# Patient Record
Sex: Male | Born: 1950 | Race: White | Hispanic: No | Smoking: Current every day smoker
Health system: Southern US, Community
[De-identification: ages and names within clinical notes are randomized; demographics above are authoritative.]

## PROBLEM LIST (undated history)

## (undated) DIAGNOSIS — E785 Hyperlipidemia, unspecified: Secondary | ICD-10-CM

## (undated) DIAGNOSIS — N529 Male erectile dysfunction, unspecified: Secondary | ICD-10-CM

## (undated) DIAGNOSIS — M1990 Unspecified osteoarthritis, unspecified site: Secondary | ICD-10-CM

## (undated) DIAGNOSIS — F32A Depression, unspecified: Secondary | ICD-10-CM

## (undated) DIAGNOSIS — F329 Major depressive disorder, single episode, unspecified: Secondary | ICD-10-CM

## (undated) DIAGNOSIS — K635 Polyp of colon: Secondary | ICD-10-CM

## (undated) DIAGNOSIS — B019 Varicella without complication: Secondary | ICD-10-CM

## (undated) DIAGNOSIS — I10 Essential (primary) hypertension: Secondary | ICD-10-CM

## (undated) DIAGNOSIS — C4492 Squamous cell carcinoma of skin, unspecified: Secondary | ICD-10-CM

## (undated) HISTORY — DX: Squamous cell carcinoma of skin, unspecified: C44.92

## (undated) HISTORY — DX: Major depressive disorder, single episode, unspecified: F32.9

## (undated) HISTORY — DX: Polyp of colon: K63.5

## (undated) HISTORY — PX: SKIN BIOPSY: SHX1

## (undated) HISTORY — DX: Unspecified osteoarthritis, unspecified site: M19.90

## (undated) HISTORY — DX: Hyperlipidemia, unspecified: E78.5

## (undated) HISTORY — DX: Male erectile dysfunction, unspecified: N52.9

## (undated) HISTORY — DX: Varicella without complication: B01.9

## (undated) HISTORY — DX: Depression, unspecified: F32.A

---

## 2017-08-30 ENCOUNTER — Emergency Department: Payer: Medicare Other

## 2017-08-30 ENCOUNTER — Other Ambulatory Visit: Payer: Self-pay

## 2017-08-30 ENCOUNTER — Ambulatory Visit (HOSPITAL_COMMUNITY)
Admission: AD | Admit: 2017-08-30 | Discharge: 2017-08-30 | Disposition: A | Discharge status: Home / Self Care | Payer: Medicare Other | Source: Other Acute Inpatient Hospital | Attending: Emergency Medicine | Admitting: Emergency Medicine

## 2017-08-30 ENCOUNTER — Encounter: Payer: Self-pay | Admitting: Emergency Medicine

## 2017-08-30 ENCOUNTER — Emergency Department
Admission: EM | Admit: 2017-08-30 | Discharge: 2017-08-30 | Disposition: A | Discharge status: Other Acute Inpatient Hospital | Payer: Medicare Other | Attending: Emergency Medicine | Admitting: Emergency Medicine

## 2017-08-30 DIAGNOSIS — S2220XA Unspecified fracture of sternum, initial encounter for closed fracture: Secondary | ICD-10-CM | POA: Insufficient documentation

## 2017-08-30 DIAGNOSIS — I10 Essential (primary) hypertension: Secondary | ICD-10-CM | POA: Insufficient documentation

## 2017-08-30 DIAGNOSIS — Y929 Unspecified place or not applicable: Secondary | ICD-10-CM | POA: Diagnosis not present

## 2017-08-30 DIAGNOSIS — F17210 Nicotine dependence, cigarettes, uncomplicated: Secondary | ICD-10-CM | POA: Insufficient documentation

## 2017-08-30 DIAGNOSIS — Y999 Unspecified external cause status: Secondary | ICD-10-CM | POA: Insufficient documentation

## 2017-08-30 DIAGNOSIS — Y9389 Activity, other specified: Secondary | ICD-10-CM | POA: Diagnosis not present

## 2017-08-30 DIAGNOSIS — S01112A Laceration without foreign body of left eyelid and periocular area, initial encounter: Secondary | ICD-10-CM | POA: Insufficient documentation

## 2017-08-30 DIAGNOSIS — R55 Syncope and collapse: Secondary | ICD-10-CM | POA: Insufficient documentation

## 2017-08-30 DIAGNOSIS — R079 Chest pain, unspecified: Secondary | ICD-10-CM | POA: Insufficient documentation

## 2017-08-30 DIAGNOSIS — R0902 Hypoxemia: Secondary | ICD-10-CM | POA: Insufficient documentation

## 2017-08-30 DIAGNOSIS — R0989 Other specified symptoms and signs involving the circulatory and respiratory systems: Secondary | ICD-10-CM | POA: Diagnosis not present

## 2017-08-30 DIAGNOSIS — W2209XA Striking against other stationary object, initial encounter: Secondary | ICD-10-CM | POA: Insufficient documentation

## 2017-08-30 DIAGNOSIS — J690 Pneumonitis due to inhalation of food and vomit: Secondary | ICD-10-CM | POA: Diagnosis not present

## 2017-08-30 DIAGNOSIS — J189 Pneumonia, unspecified organism: Secondary | ICD-10-CM

## 2017-08-30 HISTORY — DX: Essential (primary) hypertension: I10

## 2017-08-30 LAB — CBC
HCT: 39.5 % — ABNORMAL LOW (ref 40.0–52.0)
Hemoglobin: 13.6 g/dL (ref 13.0–18.0)
MCH: 30.4 pg (ref 26.0–34.0)
MCHC: 34.4 g/dL (ref 32.0–36.0)
MCV: 88.3 fL (ref 80.0–100.0)
PLATELETS: 203 10*3/uL (ref 150–440)
RBC: 4.47 MIL/uL (ref 4.40–5.90)
RDW: 13.4 % (ref 11.5–14.5)
WBC: 8.8 10*3/uL (ref 3.8–10.6)

## 2017-08-30 LAB — BASIC METABOLIC PANEL
Anion gap: 12 (ref 5–15)
BUN: 18 mg/dL (ref 6–20)
CALCIUM: 9.4 mg/dL (ref 8.9–10.3)
CO2: 25 mmol/L (ref 22–32)
CREATININE: 1.11 mg/dL (ref 0.61–1.24)
Chloride: 97 mmol/L — ABNORMAL LOW (ref 101–111)
GFR calc non Af Amer: >60 mL/min (ref >60)
Glucose, Bld: 172 mg/dL — ABNORMAL HIGH (ref 65–99)
Potassium: 3.8 mmol/L (ref 3.5–5.1)
SODIUM: 134 mmol/L — AB (ref 135–145)

## 2017-08-30 LAB — TROPONIN I

## 2017-08-30 MED ORDER — HYDROMORPHONE HCL 1 MG/ML IJ SOLN
1.0000 mg | Freq: Once | INTRAMUSCULAR | Status: AC
  Administered 2017-08-30: 1 mg via INTRAVENOUS
  Filled 2017-08-30: qty 1

## 2017-08-30 MED ORDER — ONDANSETRON HCL 4 MG/2ML IJ SOLN
4.0000 mg | Freq: Once | INTRAMUSCULAR | Status: AC
  Administered 2017-08-30: 4 mg via INTRAVENOUS

## 2017-08-30 MED ORDER — ONDANSETRON HCL 4 MG/2ML IJ SOLN
4.0000 mg | Freq: Once | INTRAMUSCULAR | Status: AC
  Administered 2017-08-30: 4 mg via INTRAVENOUS
  Filled 2017-08-30: qty 2

## 2017-08-30 MED ORDER — IOPAMIDOL (ISOVUE-300) INJECTION 61%
125.0000 mL | Freq: Once | INTRAVENOUS | Status: AC | PRN
  Administered 2017-08-30: 125 mL via INTRAVENOUS

## 2017-08-30 MED ORDER — HYDROMORPHONE HCL 1 MG/ML IJ SOLN
1.0000 mg | Freq: Once | INTRAMUSCULAR | Status: AC
  Administered 2017-08-30: 1 mg via INTRAVENOUS

## 2017-08-30 MED ORDER — HYDROMORPHONE HCL 1 MG/ML IJ SOLN
INTRAMUSCULAR | Status: AC
  Administered 2017-08-30: 1 mg via INTRAVENOUS
  Filled 2017-08-30: qty 1

## 2017-08-30 MED ORDER — ONDANSETRON HCL 4 MG/2ML IJ SOLN
INTRAMUSCULAR | Status: AC
  Administered 2017-08-30: 4 mg via INTRAVENOUS
  Filled 2017-08-30: qty 2

## 2017-08-30 NOTE — ED Provider Notes (Signed)
-----------------------------------------   4:01 PM on 08/30/2017 -----------------------------------------  Pt brought to low acuity area. Found to be splinting and unable to take a deep breath or lie flat.  + lung sounds cxr  Reassuring. Concern on exam for sternal fx.  Pt requiring higher level of care and he has been immediately transferred to physician in acute care area, report given   Schuyler Amor, MD 08/30/17 [phone]

## 2017-08-30 NOTE — ED Provider Notes (Signed)
[facility] Emergency Department Provider Note ____________________________________________   First MD Initiated Contact with Patient 08/30/17 1601     (approximate)  I have reviewed the triage vital signs and the nursing notes.   HISTORY  Chief Complaint Chest Pain    HPI Samuel Mccormick is a 66 y.o. male past medical history of hypertension who presents with chest injury, acute onset proximal an hour ago when patient states he was eating something, started to feel he was choking and lost consciousness.  Patient states that when he awoke he was on the ground.  He believes that he passed out and struck his chest and his head on heater.  Patient then was able to drive himself to the emergency department, but reports persistent severe sternal area chest pain and associated shortness of breath.  He also reports mild pain where he has a laceration above his left eyebrow, but no generalized headache, or nausea or vomiting.  He denies feel lightheaded or dizzy prior to the choking episode, and has been in his usual state of health until this afternoon.  Past Medical History:  Diagnosis Date  . Hypertension     There are no active problems to display for this patient.   History reviewed. No pertinent surgical history.  Prior to Admission medications   Not on File    Allergies Patient has no known allergies.  History reviewed. No pertinent family history.  Social History Social History   Tobacco Use  . Smoking status: Current Every Day Smoker    Packs/day: 1.00    Types: Cigarettes  . Smokeless tobacco: Never Used  Substance Use Topics  . Alcohol use: Yes    Alcohol/week: 8.4 oz    Types: 14 Shots of liquor per week  . Drug use: No    Review of Systems  Constitutional: No fever. Eyes: No visual changes. ENT: No neck pain. Cardiovascular: Positive for chest pain. Respiratory: Positive for shortness of breath. Gastrointestinal: No nausea, no  vomiting.   Genitourinary: Negative for flank pain.  Musculoskeletal: Negative for back pain. Skin: Negative for rash. Neurological: Negative for headache.   ____________________________________________   PHYSICAL EXAM:  VITAL SIGNS: ED Triage Vitals  Enc Vitals Group     BP 08/30/17 1443 133/72     Pulse Rate 08/30/17 1443 97     Resp 08/30/17 1443 18     Temp 08/30/17 1443 98.1 F (36.7 C)     Temp Source 08/30/17 1443 Oral     SpO2 08/30/17 1443 97 %     Weight 08/30/17 1445 260 lb (117.9 kg)     Height 08/30/17 1445 5\' 9"  (1.753 m)     Head Circumference --      Peak Flow --      Pain Score 08/30/17 1442 10     Pain Loc --      Pain Edu? --      Excl. in [place]? --     Constitutional: Alert and oriented.  Uncomfortable appearing, but in no acute distress. Eyes: Conjunctivae are normal.  EOMI.  PERRLA. Head: 1 cm superficial laceration to lateral left eyebrow area. Nose: No congestion/rhinnorhea. Mouth/Throat: Mucous membranes are dry.    Neck: Normal range of motion.  Cervical spine nontender. Cardiovascular: Normal rate, regular rhythm. Grossly normal heart sounds.  Good peripheral circulation.  Moderate tenderness to sternal area. Respiratory: Normal respiratory effort.  No retractions.  Scattered rhonchi. Gastrointestinal: Soft and nontender.  Genitourinary: No flank tenderness. Musculoskeletal:  Extremities warm and well perfused.  Neurologic:  Normal speech and language. No gross focal neurologic deficits are appreciated.  Skin:  Skin is warm and dry. No rash noted. Psychiatric: Mood and affect are normal. Speech and behavior are normal.  ____________________________________________   LABS (all labs ordered are listed, but only abnormal results are displayed)  Labs Reviewed  BASIC METABOLIC PANEL - Abnormal; Notable for the following components:      Result Value   Sodium 134 (*)    Chloride 97 (*)    Glucose, Bld 172 (*)    All other components within  normal limits  CBC - Abnormal; Notable for the following components:   HCT 39.5 (*)    All other components within normal limits  TROPONIN I  BLOOD GAS, VENOUS   ____________________________________________  EKG  ED ECG REPORT I, Arta Silence, the attending physician, personally viewed and interpreted this ECG.  Date: 08/30/2017 EKG Time: 1440 Rate: 99 Rhythm: normal sinus rhythm QRS Axis: normal Intervals: normal ST/T Wave abnormalities: normal Narrative Interpretation: no evidence of acute ischemia  ____________________________________________  RADIOLOGY  CXR: Cortical irregularity of mid sternum, cannot r/o fracture CT chest: sternal fracture, pulmonary findings of possible early aspiration/pneumonitis CT abd: No traumatic injury  CT head: No ICH CT c-spine: No fracture   ____________________________________________   PROCEDURES  Procedure(s) performed: No    Critical Care performed: Yes  CRITICAL CARE Performed by: Arta Silence   Total critical care time: 40 minutes  Critical care time was exclusive of separately billable procedures and treating other patients.  Critical care was necessary to treat or prevent imminent or life-threatening deterioration.  Critical care was time spent personally by me on the following activities: development of treatment plan with patient and/or surrogate as well as nursing, discussions with consultants, evaluation of patient's response to treatment, examination of patient, obtaining history from patient or surrogate, ordering and performing treatments and interventions, ordering and review of laboratory studies, ordering and review of radiographic studies, pulse oximetry and re-evaluation of patient's condition. ____________________________________________   INITIAL IMPRESSION / ASSESSMENT AND PLAN / ED COURSE  Pertinent labs & imaging results that were available during my care of the patient were reviewed by  me and considered in my medical decision making (see chart for details).  66 year old male with past medical history as noted above presents with primary complaint of chest injury after syncopal rising during a brief choking episode, and falling from being seated onto heater.  Patient immediately regained consciousness and denies any prodrome or other recent symptoms leading into the choking or syncopal episode.  He also denies any current throat discomfort or persistent choking.  Past medical records reviewed in Epic and are noncontributory to this visit.  On exam, patient's vital signs are normal, but he is quite uncomfortable appearing and has significant pain to his sternum.  There are some rhonchi on lung exam, and moderate tenderness to the sternal area.  He has a small laceration to his left eyebrow.  The remainder of the exam is unremarkable.  Presentation is concerning for sternal fracture, and we also must consider pneumothorax, hemothorax, or pulmonary contusion contributed patient's symptoms.  I have low suspicion for ICH, however given patient's age we will obtain a CT head and C-spine.  Will also obtain CT abdomen pelvis to rule out any intra-abdominal injury.  In terms of the syncope, it appears to be related to an acute choking episode, and there is no evidence of cardiac or other  specific precipitating cause.  EKG is normal.  We will obtain basic labs.    ----------------------------------------- 6:05 PM on 08/30/2017 -----------------------------------------  After 2 more grams of Dilaudid patient was able to tolerate laying flat for CT.  CT reveals a sternal fracture, and there are pulmonary findings suggestive of possible early aspiration/pneumonitis.  Patient is hypoxic to the mid 80s on room air however on 3 L by nasal cannula he was in the low 90s.  He requires continuous pulse oximetry and frequent reassessment.  Given the traumatic injury with hypoxia and need for IV  analgesia for pain control, patient is not safe for discharge home at this time.  I discussed this with the patient and have arranged transfer to [facility].  I discussed the case and consulted with Dr. Pete Glatter of the [facility] emergency department and we will transferred the patient to the [facility] ED.  We discussed the CT findings of possible pneumonitis, and Dr. Pete Glatter recommended we hold off on antibiotics for now and he will reassess when the patient arrives.    ____________________________________________   FINAL CLINICAL IMPRESSION(S) / ED DIAGNOSES  Final diagnoses:  Closed fracture of sternum, unspecified portion of sternum, initial encounter  Hypoxia  Pneumonitis      NEW MEDICATIONS STARTED DURING THIS VISIT:  This SmartLink is deprecated. Use AVSMEDLIST instead to display the medication list for a patient.   Note:  This document was prepared using Dragon voice recognition software and may include unintentional dictation errors.    Arta Silence, MD 08/30/17 [phone]

## 2017-08-30 NOTE — ED Triage Notes (Addendum)
Pt states he was eating something and feels like he got choked. All pt remembers after that is coughing and then waking up on the floor. Pt thinks he passed out and struck his head and chest on heater. Bruising noted to chest, laceration to L eyebrow with dried blood running down pt's face.

## 2017-09-02 LAB — BLOOD GAS, VENOUS
Acid-base deficit: 0.6 mmol/L (ref 0.0–2.0)
Bicarbonate: 26.3 mmol/L (ref 20.0–28.0)
FIO2: 0.21
O2 Saturation: UNDETERMINED %
PH VEN: 7.32 (ref 7.250–7.430)
Patient temperature: 37
pCO2, Ven: 51 mmHg (ref 44.0–60.0)
pO2, Ven: UNDETERMINED mmHg (ref 32.0–45.0)

## 2017-10-06 ENCOUNTER — Ambulatory Visit (INDEPENDENT_AMBULATORY_CARE_PROVIDER_SITE_OTHER): Payer: Medicare Other | Admitting: Internal Medicine

## 2017-10-06 ENCOUNTER — Encounter: Payer: Self-pay | Admitting: Internal Medicine

## 2017-10-06 ENCOUNTER — Ambulatory Visit (INDEPENDENT_AMBULATORY_CARE_PROVIDER_SITE_OTHER): Payer: Medicare Other

## 2017-10-06 VITALS — BP 128/68 | HR 70 | Temp 97.9°F | Resp 16 | Ht 69.0 in | Wt 277.4 lb

## 2017-10-06 DIAGNOSIS — F17210 Nicotine dependence, cigarettes, uncomplicated: Secondary | ICD-10-CM

## 2017-10-06 DIAGNOSIS — R739 Hyperglycemia, unspecified: Secondary | ICD-10-CM

## 2017-10-06 DIAGNOSIS — Z720 Tobacco use: Secondary | ICD-10-CM

## 2017-10-06 DIAGNOSIS — Z23 Encounter for immunization: Secondary | ICD-10-CM

## 2017-10-06 DIAGNOSIS — G8929 Other chronic pain: Secondary | ICD-10-CM

## 2017-10-06 DIAGNOSIS — M25562 Pain in left knee: Secondary | ICD-10-CM

## 2017-10-06 DIAGNOSIS — Z13818 Encounter for screening for other digestive system disorders: Secondary | ICD-10-CM

## 2017-10-06 DIAGNOSIS — R7303 Prediabetes: Secondary | ICD-10-CM

## 2017-10-06 DIAGNOSIS — R490 Dysphonia: Secondary | ICD-10-CM

## 2017-10-06 DIAGNOSIS — I10 Essential (primary) hypertension: Secondary | ICD-10-CM

## 2017-10-06 DIAGNOSIS — K760 Fatty (change of) liver, not elsewhere classified: Secondary | ICD-10-CM

## 2017-10-06 DIAGNOSIS — Z1159 Encounter for screening for other viral diseases: Secondary | ICD-10-CM

## 2017-10-06 DIAGNOSIS — Z13220 Encounter for screening for lipoid disorders: Secondary | ICD-10-CM

## 2017-10-06 DIAGNOSIS — D649 Anemia, unspecified: Secondary | ICD-10-CM

## 2017-10-06 DIAGNOSIS — R1310 Dysphagia, unspecified: Secondary | ICD-10-CM

## 2017-10-06 DIAGNOSIS — N401 Enlarged prostate with lower urinary tract symptoms: Secondary | ICD-10-CM

## 2017-10-06 DIAGNOSIS — R351 Nocturia: Secondary | ICD-10-CM

## 2017-10-06 LAB — URINALYSIS, ROUTINE W REFLEX MICROSCOPIC
Bilirubin Urine: NEGATIVE
HGB URINE DIPSTICK: NEGATIVE
Ketones, ur: NEGATIVE
Leukocytes, UA: NEGATIVE
Nitrite: NEGATIVE
RBC / HPF: NONE SEEN (ref 0–?)
Specific Gravity, Urine: 1.02 (ref 1.000–1.030)
TOTAL PROTEIN, URINE-UPE24: NEGATIVE
URINE GLUCOSE: NEGATIVE
Urobilinogen, UA: 0.2 (ref 0.0–1.0)
WBC, UA: NONE SEEN (ref 0–?)
pH: 6 (ref 5.0–8.0)

## 2017-10-06 LAB — LIPID PANEL
CHOL/HDL RATIO: 3
Cholesterol: 177 mg/dL (ref 0–200)
HDL: 57.2 mg/dL (ref >39.00)
LDL Cholesterol: 81 mg/dL (ref 0–99)
NonHDL: 119.77
TRIGLYCERIDES: 194 mg/dL — AB (ref 0.0–149.0)
VLDL: 38.8 mg/dL (ref 0.0–40.0)

## 2017-10-06 LAB — HEPATIC FUNCTION PANEL
ALK PHOS: 62 U/L (ref 39–117)
ALT: 20 U/L (ref 0–53)
AST: 23 U/L (ref 0–37)
Albumin: 4.7 g/dL (ref 3.5–5.2)
Bilirubin, Direct: 0.1 mg/dL (ref 0.0–0.3)
Total Bilirubin: 0.5 mg/dL (ref 0.2–1.2)
Total Protein: 7.2 g/dL (ref 6.0–8.3)

## 2017-10-06 LAB — HEMOGLOBIN A1C: Hgb A1c MFr Bld: 6 % (ref 4.6–6.5)

## 2017-10-06 NOTE — Progress Notes (Addendum)
Chief Complaint  Patient presents with  . Establish Care   Establish care recently moved from [facility] though from [place] 1. HTN controlled on Lis-HCT 20-12.5 mg, Metoprolol 25 XL, norvasc 10  2. He c/o hoarseness x 1 year. He has never seen ENT  3. C/o worsening dysphagia he can even choke on his saliva and 08/2017 he has episode sitting on a stool choked on his saliva and had LOC per pt x 10-15 minutes falling off of stool and fracturing sternum. He was seen and tx'ed at [facility] trauma. As of 09/23/17 notes reviewed and fracture better. Pain is also better for pt he was on narcotics but now no longer taking oxycodone  4. He would like flu shot 5. Tobacco abuse since age 28s on and off mostly on > 1ppd or higher not FH lung cancer per pt  6. C/o left knee pain chronic and intermittent not as bothersome today had Oxycodone, tramadol 50 mg prn and prn NSAID to take. No longer needs narcotics    Review of Systems  Constitutional: Negative for weight loss.  HENT:       +hoarseness/changing voice never seen ENT ongoing x 1 year  +dysphagia choked 08/2017 causing him to pass out x 10-15 minutes per pt   Eyes:       No vision problems   Respiratory: Positive for cough. Negative for shortness of breath.   Cardiovascular: Positive for leg swelling. Negative for chest pain.  Gastrointestinal: Negative for abdominal pain.  Genitourinary:       +nocturia not bothersome +difficulty starting keeping stream +ED  Musculoskeletal: Positive for falls and joint pain.       Fall 08/2017 off stool and fractured Sternum seen at [facility] trauma clinic  +intermittent left knee pain  Skin: Negative for rash.  Neurological: Positive for loss of consciousness.       LOC 08/2017 with choking 08/2017   Psychiatric/Behavioral: Negative for depression.   Past Medical History:  Diagnosis Date  . Arthritis   . Chicken pox   . Colon polyps   . Depression   . Hyperlipidemia   . Hypertension   . Squamous cell skin  cancer    Past Surgical History:  Procedure Laterality Date  . SKIN BIOPSY     Family History  Problem Relation Age of Onset  . Cancer Mother        breast  . Other Mother        brain ? cancer   . Arthritis Sister   . COPD Sister   . Diabetes Sister   . Heart disease Sister   . Hyperlipidemia Sister   . Hypertension Sister   . Mental illness Sister   . Mental retardation Sister    Social History   Socioeconomic History  . Marital status: Widowed    Spouse name: Not on file  . Number of children: Not on file  . Years of education: Not on file  . Highest education level: Not on file  Social Needs  . Financial resource strain: Not on file  . Food insecurity - worry: Not on file  . Food insecurity - inability: Not on file  . Transportation needs - medical: Not on file  . Transportation needs - non-medical: Not on file  Occupational History  . Not on file  Tobacco Use  . Smoking status: Current Every Day Smoker    Packs/day: 1.00    Types: Cigarettes  . Smokeless tobacco: Never Used  Substance  and Sexual Activity  . Alcohol use: Yes    Alcohol/week: 8.4 oz    Types: 14 Shots of liquor per week  . Drug use: No  . Sexual activity: No  Other Topics Concern  . Not on file  Social History Narrative   From area lived in [place] recently moved back home    Retired from working in Management consultant medicine    1 son lives in [place]    Some college    Current Meds  Medication Sig  . acetaminophen (TYLENOL) 650 MG CR tablet Take 650 mg by mouth every 8 (eight) hours as needed for pain (alternating with Ibuprofen).  Marland Kitchen amLODipine (NORVASC) 10 MG tablet Take 10 mg by mouth.  . Cholecalciferol (VITAMIN D3) 2000 units TABS Take by mouth.  . fenofibrate 160 MG tablet Take 160 mg by mouth.  . gabapentin (NEURONTIN) 100 MG capsule take 2 capsules by mouth three times a day  . ibuprofen (ADVIL,MOTRIN) 800 MG tablet Take 800 mg by mouth as needed.   . [EXPIRED] lidocaine  (LIDODERM) 5 % Place onto the skin.  Marland Kitchen lisinopril-hydrochlorothiazide (PRINZIDE,ZESTORETIC) 20-12.5 MG tablet Take by mouth.  . metoprolol succinate (TOPROL-XL) 25 MG 24 hr tablet Take 25 mg by mouth.  . nicotine (NICODERM CQ - DOSED IN MG/24 HOURS) 21 mg/24hr patch place 1 patch ON THE SKIN daily  . omeprazole (PRILOSEC) 40 MG capsule Take 40 mg by mouth.  . RA COL-RITE 100 MG capsule Take 100 mg by mouth 2 (two) times daily.  . sildenafil (VIAGRA) 100 MG tablet Take 100 mg by mouth daily as needed for erectile dysfunction.  . traMADol (ULTRAM) 50 MG tablet Take by mouth daily as needed (left knee pain).  . [DISCONTINUED] oxyCODONE (OXY IR/ROXICODONE) 5 MG immediate release tablet MAY TAKE 1 to 2 tablets by mouth every 4 to 6 hours if needed for pain   No Known Allergies Recent Results (from the past 2160 hour(s))  Basic metabolic panel     Status: Abnormal   Collection Time: 08/30/17  2:46 PM  Result Value Ref Range   Sodium 134 (L) 135 - 145 mmol/L   Potassium 3.8 3.5 - 5.1 mmol/L   Chloride 97 (L) 101 - 111 mmol/L   CO2 25 22 - 32 mmol/L   Glucose, Bld 172 (H) 65 - 99 mg/dL   BUN 18 6 - 20 mg/dL   Creatinine, Ser 1.11 0.61 - 1.24 mg/dL   Calcium 9.4 8.9 - 10.3 mg/dL   GFR calc non Af Amer >60 >60 mL/min   GFR calc Af Amer >60 >60 mL/min    Comment: (NOTE) The eGFR has been calculated using the CKD EPI equation. This calculation has not been validated in all clinical situations. eGFR's persistently <60 mL/min signify possible Chronic Kidney Disease.    Anion gap 12 5 - 15  CBC     Status: Abnormal   Collection Time: 08/30/17  2:46 PM  Result Value Ref Range   WBC 8.8 3.8 - 10.6 K/uL   RBC 4.47 4.40 - 5.90 MIL/uL   Hemoglobin 13.6 13.0 - 18.0 g/dL   HCT 39.5 (L) 40.0 - 52.0 %   MCV 88.3 80.0 - 100.0 fL   MCH 30.4 26.0 - 34.0 pg   MCHC 34.4 32.0 - 36.0 g/dL   RDW 13.4 11.5 - 14.5 %   Platelets 203 150 - 440 K/uL  Troponin I     Status: None   Collection  Time: 08/30/17   2:46 PM  Result Value Ref Range   Troponin I <0.03 <0.03 ng/mL  Blood gas, venous     Status: None   Collection Time: 08/30/17  5:55 PM  Result Value Ref Range   FIO2 0.21    pH, Ven 7.32 7.250 - 7.430   pCO2, Ven 51 44.0 - 60.0 mmHg   pO2, Ven UNABLE TO CALCULATE. 32.0 - 45.0 mmHg   Bicarbonate 26.3 20.0 - 28.0 mmol/L   Acid-base deficit 0.6 0.0 - 2.0 mmol/L   O2 Saturation UNABLE TO CALCULATE. %   Patient temperature 37.0    Collection site VENOUS    Sample type VENOUS   Hepatic function panel     Status: None   Collection Time: 10/06/17  9:57 AM  Result Value Ref Range   Total Bilirubin 0.5 0.2 - 1.2 mg/dL   Bilirubin, Direct 0.1 0.0 - 0.3 mg/dL   Alkaline Phosphatase 62 39 - 117 U/L   AST 23 0 - 37 U/L   ALT 20 0 - 53 U/L   Total Protein 7.2 6.0 - 8.3 g/dL   Albumin 4.7 3.5 - 5.2 g/dL  Hemoglobin A1c     Status: None   Collection Time: 10/06/17  9:57 AM  Result Value Ref Range   Hgb A1c MFr Bld 6.0 4.6 - 6.5 %    Comment: Glycemic Control Guidelines for People with Diabetes:Non Diabetic:  <6%Goal of Therapy: <7%Additional Action Suggested:  >8%   Lipid panel     Status: Abnormal   Collection Time: 10/06/17  9:57 AM  Result Value Ref Range   Cholesterol 177 0 - 200 mg/dL    Comment: ATP III Classification       Desirable:  < 200 mg/dL               Borderline High:  200 - 239 mg/dL          High:  > = 240 mg/dL   Triglycerides 194.0 (H) 0.0 - 149.0 mg/dL    Comment: Normal:  <150 mg/dLBorderline High:  150 - 199 mg/dL   HDL 57.20 >39.00 mg/dL   VLDL 38.8 0.0 - 40.0 mg/dL   LDL Cholesterol 81 0 - 99 mg/dL   Total CHOL/HDL Ratio 3     Comment:                Men          Women1/2 Average Risk     3.4          3.3Average Risk          5.0          4.42X Average Risk          9.6          7.13X Average Risk          15.0          11.0                       NonHDL 119.77     Comment: NOTE:  Non-HDL goal should be 30 mg/dL higher than patient's LDL goal (i.e. LDL goal of < 70  mg/dL, would have non-HDL goal of < 100 mg/dL)  Urinalysis, Routine w reflex microscopic     Status: None   Collection Time: 10/06/17  9:57 AM  Result Value Ref Range   Color, Urine YELLOW Yellow;Lt. Yellow   APPearance CLEAR Clear  Specific Gravity, Urine 1.020 1.000 - 1.030   pH 6.0 5.0 - 8.0   Total Protein, Urine NEGATIVE Negative   Urine Glucose NEGATIVE Negative   Ketones, ur NEGATIVE Negative   Bilirubin Urine NEGATIVE Negative   Hgb urine dipstick NEGATIVE Negative   Urobilinogen, UA 0.2 0.0 - 1.0   Leukocytes, UA NEGATIVE Negative   Nitrite NEGATIVE Negative   WBC, UA none seen 0-2/hpf   RBC / HPF none seen 0-2/hpf   Objective  Body mass index is 40.96 kg/m. Wt Readings from Last 3 Encounters:  10/06/17 277 lb 6 oz (125.8 kg)  08/30/17 260 lb (117.9 kg)   Temp Readings from Last 3 Encounters:  10/06/17 97.9 F (36.6 C) (Oral)  08/30/17 97.6 F (36.4 C)   BP Readings from Last 3 Encounters:  10/06/17 128/68  08/30/17 (!) 148/79   Pulse Readings from Last 3 Encounters:  10/06/17 70  08/30/17 77   O2 sat room air 97%   Physical Exam  Constitutional: He is oriented to person, place, and time and well-developed, well-nourished, and in no distress. Vital signs are normal.  HENT:  Head: Normocephalic.  Mouth/Throat:    Right lower lip likely venous lake   Eyes: Conjunctivae are normal. Pupils are equal, round, and reactive to light.  Cardiovascular: Normal rate, regular rhythm and normal heart sounds.  +lymphedema noted b/l legs nonpitting.   Pulmonary/Chest: Effort normal and breath sounds normal.    +course breath sounds b/l   Abdominal: Soft. Bowel sounds are normal. There is no tenderness.  Neurological: He is alert and oriented to person, place, and time. Gait normal. Gait normal.  Skin: Skin is warm, dry and intact.  Psychiatric: Mood, memory, affect and judgment normal.  Nursing note and vitals reviewed.      Assessment   1. HTN controlled    2. Hyperglycemia w/o DM 2/obesity BMI 40.96  3. Dysphagia with even saliva  4. Hoarseness x 1 year never seen ENT  5. Tobacco abuse  6. Recent syncope s/p #3 with sternal fracture now healing  CXR 09/23/17 with remote sternal fracture deformity with ? Mild anterior displacement rec ed sternal radiographs for better assessment 7. HM 8. Mildly enlarged prostate with cental calcifications noted CT ab/pelvis 08/30/17  9. Normocytic Anemia noted 09/04/17 care everywhere H/H 10.5/31.1 10. Left knee pain chronic and intermittent Xray negative today  Plan   1. Cont lis-hct 20-12.5 need to confirm at f/u if taking 1 or 2 pills per day  Cont norvasc 10 mg qd  Cont toprol xl 25 mg qd  2. Check a1C today, check lipid  Congratulated started to be more physically active  3. Consider GI referral at f/u  4. Consider ENT referral at f/u  5 and 6. rec smoking cessation  Consider pfts to w/u COPD and albuterol inhaler prn in the future   Recent f/u [facility] trauma/general surgery 09/23/17 doing better f/u prn per note reviewed  Spent 3 minutes disc smoking and side effects. He is currently using nicotine patch trying to quit  Disc rec CT chest yearly with hx smoking  Reviewed CT chest 08/30/17 with ground glass opacities in right lower lobe   7.  Given flu shot today  Need to confirm other vaccines with prev PCP Dr. 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 Smoker on and off since age 21s has quit x 4 years previously smokes  >1ppd or more no FH lung cancer   8. Pt declines tx for now  Need to do DRE in future  9.  Consider check anemia panel in future  10 Xray left knee today  Prn Tylenol for now   Pharmacy prefers [facility]   Provider: Dr. Olivia Mackie McLean-Scocuzza-Internal Medicine

## 2017-10-06 NOTE — Patient Instructions (Signed)
Please follow up in 1 month  Take care  Consider shingrix vaccine  Please try to stop smoking   Hypertension Hypertension is another name for high blood pressure. High blood pressure forces your heart to work harder to pump blood. This can cause problems over time. There are two numbers in a blood pressure reading. There is a top number (systolic) over a bottom number (diastolic). It is best to have a blood pressure below 120/80. Healthy choices can help lower your blood pressure. You may need medicine to help lower your blood pressure if:  Your blood pressure cannot be lowered with healthy choices.  Your blood pressure is higher than 130/80.  Follow these instructions at home: Eating and drinking  If directed, follow the DASH eating plan. This diet includes: ? Filling half of your plate at each meal with fruits and vegetables. ? Filling one quarter of your plate at each meal with whole grains. Whole grains include whole wheat pasta, brown rice, and whole grain bread. ? Eating or drinking low-fat dairy products, such as skim milk or low-fat yogurt. ? Filling one quarter of your plate at each meal with low-fat (lean) proteins. Low-fat proteins include fish, skinless chicken, eggs, beans, and tofu. ? Avoiding fatty meat, cured and processed meat, or chicken with skin. ? Avoiding premade or processed food.  Eat less than 1,500 mg of salt (sodium) a day.  Limit alcohol use to no more than 1 drink a day for nonpregnant women and 2 drinks a day for men. One drink equals 12 oz of beer, 5 oz of wine, or 1 oz of hard liquor. Lifestyle  Work with your doctor to stay at a healthy weight or to lose weight. Ask your doctor what the best weight is for you.  Get at least 30 minutes of exercise that causes your heart to beat faster (aerobic exercise) most days of the week. This may include walking, swimming, or biking.  Get at least 30 minutes of exercise that strengthens your muscles (resistance  exercise) at least 3 days a week. This may include lifting weights or pilates.  Do not use any products that contain nicotine or tobacco. This includes cigarettes and e-cigarettes. If you need help quitting, ask your doctor.  Check your blood pressure at home as told by your doctor.  Keep all follow-up visits as told by your doctor. This is important. Medicines  Take over-the-counter and prescription medicines only as told by your doctor. Follow directions carefully.  Do not skip doses of blood pressure medicine. The medicine does not work as well if you skip doses. Skipping doses also puts you at risk for problems.  Ask your doctor about side effects or reactions to medicines that you should watch for. Contact a doctor if:  You think you are having a reaction to the medicine you are taking.  You have headaches that keep coming back (recurring).  You feel dizzy.  You have swelling in your ankles.  You have trouble with your vision. Get help right away if:  You get a very bad headache.  You start to feel confused.  You feel weak or numb.  You feel faint.  You get very bad pain in your: ? Chest. ? Belly (abdomen).  You throw up (vomit) more than once.  You have trouble breathing. Summary  Hypertension is another name for high blood pressure.  Making healthy choices can help lower blood pressure. If your blood pressure cannot be controlled with healthy choices,  you may need to take medicine. This information is not intended to replace advice given to you by your health care provider. Make sure you discuss any questions you have with your health care provider. Document Released: 02/18/2008 Document Revised: 07/30/2016 Document Reviewed: 07/30/2016 Elsevier Interactive Patient Education  [employer].

## 2017-10-07 DIAGNOSIS — Z720 Tobacco use: Secondary | ICD-10-CM | POA: Insufficient documentation

## 2017-10-07 DIAGNOSIS — D649 Anemia, unspecified: Secondary | ICD-10-CM | POA: Insufficient documentation

## 2017-10-07 DIAGNOSIS — R7303 Prediabetes: Secondary | ICD-10-CM | POA: Insufficient documentation

## 2017-10-07 DIAGNOSIS — N400 Enlarged prostate without lower urinary tract symptoms: Secondary | ICD-10-CM | POA: Insufficient documentation

## 2017-10-07 DIAGNOSIS — R1310 Dysphagia, unspecified: Secondary | ICD-10-CM | POA: Insufficient documentation

## 2017-10-07 DIAGNOSIS — R490 Dysphonia: Secondary | ICD-10-CM | POA: Insufficient documentation

## 2017-10-07 DIAGNOSIS — M25562 Pain in left knee: Secondary | ICD-10-CM | POA: Insufficient documentation

## 2017-10-07 DIAGNOSIS — K760 Fatty (change of) liver, not elsewhere classified: Secondary | ICD-10-CM | POA: Insufficient documentation

## 2017-10-07 DIAGNOSIS — I10 Essential (primary) hypertension: Secondary | ICD-10-CM | POA: Insufficient documentation

## 2017-10-07 LAB — HEPATITIS C ANTIBODY
Hepatitis C Ab: NONREACTIVE
SIGNAL TO CUT-OFF: 0.01 (ref <1.00)

## 2017-10-07 LAB — HEPATITIS B SURFACE ANTIBODY, QUANTITATIVE: Hepatitis B-Post: 521 m[IU]/mL (ref >=10)

## 2017-10-07 LAB — HEPATITIS B SURFACE ANTIGEN: Hepatitis B Surface Ag: NONREACTIVE

## 2017-11-06 ENCOUNTER — Encounter: Payer: Self-pay | Admitting: Internal Medicine

## 2017-11-06 ENCOUNTER — Ambulatory Visit (INDEPENDENT_AMBULATORY_CARE_PROVIDER_SITE_OTHER): Payer: Medicare HMO | Admitting: Internal Medicine

## 2017-11-06 VITALS — BP 140/70 | HR 78 | Temp 98.1°F | Ht 69.0 in | Wt 277.6 lb

## 2017-11-06 DIAGNOSIS — I10 Essential (primary) hypertension: Secondary | ICD-10-CM | POA: Diagnosis not present

## 2017-11-06 DIAGNOSIS — F17219 Nicotine dependence, cigarettes, with unspecified nicotine-induced disorders: Secondary | ICD-10-CM

## 2017-11-06 DIAGNOSIS — R490 Dysphonia: Secondary | ICD-10-CM | POA: Diagnosis not present

## 2017-11-06 DIAGNOSIS — J449 Chronic obstructive pulmonary disease, unspecified: Secondary | ICD-10-CM | POA: Diagnosis not present

## 2017-11-06 DIAGNOSIS — R1310 Dysphagia, unspecified: Secondary | ICD-10-CM | POA: Diagnosis not present

## 2017-11-06 DIAGNOSIS — Z720 Tobacco use: Secondary | ICD-10-CM

## 2017-11-06 DIAGNOSIS — K219 Gastro-esophageal reflux disease without esophagitis: Secondary | ICD-10-CM

## 2017-11-06 DIAGNOSIS — E781 Pure hyperglyceridemia: Secondary | ICD-10-CM

## 2017-11-06 DIAGNOSIS — G4733 Obstructive sleep apnea (adult) (pediatric): Secondary | ICD-10-CM | POA: Diagnosis not present

## 2017-11-06 DIAGNOSIS — E785 Hyperlipidemia, unspecified: Secondary | ICD-10-CM | POA: Diagnosis not present

## 2017-11-06 MED ORDER — LISINOPRIL-HYDROCHLOROTHIAZIDE 20-12.5 MG PO TABS: 1.0000 | ORAL_TABLET | Freq: Every day | ORAL | 1 refills | Status: DC

## 2017-11-06 MED ORDER — METOPROLOL SUCCINATE ER 25 MG PO TB24: 25.0000 mg | ORAL_TABLET | Freq: Every day | ORAL | 1 refills | Status: DC

## 2017-11-06 MED ORDER — OMEPRAZOLE 40 MG PO CPDR: 40.0000 mg | DELAYED_RELEASE_CAPSULE | Freq: Every day | ORAL | 1 refills | Status: DC

## 2017-11-06 MED ORDER — FENOFIBRATE 160 MG PO TABS: 160.0000 mg | ORAL_TABLET | Freq: Every day | ORAL | 1 refills | Status: DC

## 2017-11-06 MED ORDER — NICOTINE 14 MG/24HR TD PT24: 14.0000 mg | MEDICATED_PATCH | Freq: Every day | TRANSDERMAL | 0 refills | Status: DC

## 2017-11-06 MED ORDER — AMLODIPINE BESYLATE 10 MG PO TABS: 10.0000 mg | ORAL_TABLET | Freq: Every day | ORAL | 1 refills | Status: DC

## 2017-11-06 MED ORDER — ALBUTEROL SULFATE HFA 108 (90 BASE) MCG/ACT IN AERS: 1.0000 | INHALATION_SPRAY | Freq: Four times a day (QID) | RESPIRATORY_TRACT | 1 refills | Status: DC | PRN

## 2017-11-06 MED ORDER — NICOTINE 21 MG/24HR TD PT24: 21.0000 mg | MEDICATED_PATCH | Freq: Every day | TRANSDERMAL | 0 refills | Status: DC

## 2017-11-06 MED ORDER — NICOTINE 7 MG/24HR TD PT24: 7.0000 mg | MEDICATED_PATCH | Freq: Every day | TRANSDERMAL | 0 refills | Status: DC

## 2017-11-06 NOTE — Progress Notes (Signed)
Pre visit review using our clinic review tool, if applicable. No additional management support is needed unless otherwise documented below in the visit note. 

## 2017-11-06 NOTE — Patient Instructions (Signed)
Please f/u in 3 months  Will refer to [facility]NT Dr. McQueen/Vaught  Please try healthy diet and exercise  Take care   Chronic Obstructive Pulmonary Disease Chronic obstructive pulmonary disease (COPD) is a long-term (chronic) lung problem. When you have COPD, it is hard for air to get in and out of your lungs. The way your lungs work will never return to normal. Usually the condition gets worse over time. There are things you can do to keep yourself as healthy as possible. Your doctor may treat your condition with:  Medicines.  Quitting smoking, if you smoke.  Rehabilitation. This may involve a team of specialists.  Oxygen.  Exercise and changes to your diet.  Lung surgery.  Comfort measures (palliative care).  Follow these instructions at home: Medicines  Take over-the-counter and prescription medicines only as told by your doctor.  Talk to your doctor before taking any cough or allergy medicines. You may need to avoid medicines that cause your lungs to be dry. Lifestyle  If you smoke, stop. Smoking makes the problem worse. If you need help quitting, ask your doctor.  Avoid being around things that make your breathing worse. This may include smoke, chemicals, and fumes.  Stay active, but remember to also rest.  Learn and use tips on how to relax.  Make sure you get enough sleep. Most adults need at least 7 hours a night.  Eat healthy foods. Eat smaller meals more often. Rest before meals. Controlled breathing  Learn and use tips on how to control your breathing as told by your doctor. Try: ? Breathing in (inhaling) through your nose for 1 second. Then, pucker your lips and breath out (exhale) through your lips for 2 seconds. ? Putting one hand on your belly (abdomen). Breathe in slowly through your nose for 1 second. Your hand on your belly should move out. Pucker your lips and breathe out slowly through your lips. Your hand on your belly should move in as you breathe  out. Controlled coughing  Learn and use controlled coughing to clear mucus from your lungs. The steps are: 1. Lean your head a little forward. 2. Breathe in deeply. 3. Try to hold your breath for 3 seconds. 4. Keep your mouth slightly open while coughing 2 times. 5. Spit any mucus out into a tissue. 6. Rest and do the steps again 1 or 2 times as needed. General instructions  Make sure you get all the shots (vaccines) that your doctor recommends. Ask your doctor about a flu shot and a pneumonia shot.  Use oxygen therapy and therapy to help improve your lungs (pulmonary rehabilitation) if told by your doctor. If you need home oxygen therapy, ask your doctor if you should buy a tool to measure your oxygen level (oximeter).  Make a COPD action plan with your doctor. This helps you know what to do if you feel worse than usual.  Manage any other conditions you have as told by your doctor.  Avoid going outside when it is very hot, cold, or humid.  Avoid people who have a sickness you can catch (contagious).  Keep all follow-up visits as told by your doctor. This is important. Contact a doctor if:  You cough up more mucus than usual.  There is a change in the color or thickness of the mucus.  It is harder to breathe than usual.  Your breathing is faster than usual.  You have trouble sleeping.  You need to use your medicines more  often than usual.  You have trouble doing your normal activities such as getting dressed or walking around the house. Get help right away if:  You have shortness of breath while resting.  You have shortness of breath that stops you from: ? Being able to talk. ? Doing normal activities.  Your chest hurts for longer than 5 minutes.  Your skin color is more blue than usual.  Your pulse oximeter shows that you have low oxygen for longer than 5 minutes.  You have a fever.  You feel too tired to breathe normally. Summary  Chronic obstructive  pulmonary disease (COPD) is a long-term lung problem.  The way your lungs work will never return to normal. Usually the condition gets worse over time. There are things you can do to keep yourself as healthy as possible.  Take over-the-counter and prescription medicines only as told by your doctor.  If you smoke, stop. Smoking makes the problem worse. This information is not intended to replace advice given to you by your health care provider. Make sure you discuss any questions you have with your health care provider. Document Released: 02/18/2008 Document Revised: 02/07/2016 Document Reviewed: 04/28/2013 Elsevier Interactive Patient Education  2017 [employer].

## 2017-11-09 ENCOUNTER — Encounter: Payer: Self-pay | Admitting: Internal Medicine

## 2017-11-09 DIAGNOSIS — E785 Hyperlipidemia, unspecified: Secondary | ICD-10-CM | POA: Insufficient documentation

## 2017-11-09 DIAGNOSIS — G4733 Obstructive sleep apnea (adult) (pediatric): Secondary | ICD-10-CM | POA: Insufficient documentation

## 2017-11-09 NOTE — Progress Notes (Addendum)
Chief Complaint  Patient presents with  . Follow-up   F/u  1. HTN controlled on norvasc 10, lis-hct 20-12.5  2. H/o dysphagia solids and liquids and voice hoarseness in long term smoker will refer to ENT. He has been a smoker on and off since age 67 y.o  3. H/o OSA not on Cpap could not tolerate x 2-3 years. Ago when had sleep study in [place] 4. Tobacco abuse wants to do nicoderm patches    Review of Systems  Constitutional: Negative for weight loss.  HENT:       +hoarseness, dysphagia  Eyes: Negative for blurred vision.  Respiratory: Positive for cough.   Cardiovascular: Negative for chest pain.  Gastrointestinal: Negative for abdominal pain.  Musculoskeletal:       +sternum pain improved   Skin: Negative for rash.  Neurological: Negative for headaches.  Psychiatric/Behavioral: Negative for memory loss.   Past Medical History:  Diagnosis Date  . Arthritis   . Chicken pox   . Colon polyps   . Depression   . Hyperlipidemia   . Hypertension   . Squamous cell skin cancer    Past Surgical History:  Procedure Laterality Date  . SKIN BIOPSY     Family History  Problem Relation Age of Onset  . Cancer Mother        breast  . Other Mother        brain ? cancer   . Arthritis Sister   . COPD Sister   . Diabetes Sister   . Heart disease Sister   . Hyperlipidemia Sister   . Hypertension Sister   . Mental illness Sister   . Mental retardation Sister    Social History   Socioeconomic History  . Marital status: Widowed    Spouse name: Not on file  . Number of children: Not on file  . Years of education: Not on file  . Highest education level: Not on file  Social Needs  . Financial resource strain: Not on file  . Food insecurity - worry: Not on file  . Food insecurity - inability: Not on file  . Transportation needs - medical: Not on file  . Transportation needs - non-medical: Not on file  Occupational History  . Not on file  Tobacco Use  . Smoking status:  Current Every Day Smoker    Packs/day: 1.00    Types: Cigarettes  . Smokeless tobacco: Never Used  Substance and Sexual Activity  . Alcohol use: Yes    Alcohol/week: 8.4 oz    Types: 14 Shots of liquor per week  . Drug use: No  . Sexual activity: No  Other Topics Concern  . Not on file  Social History Narrative   From area lived in [place] recently moved back home    Retired from working in Management consultant medicine    1 son lives in [place]    Some college    Current Meds  Medication Sig  . acetaminophen (TYLENOL) 650 MG CR tablet Take 650 mg by mouth every 8 (eight) hours as needed for pain (alternating with Ibuprofen).  Marland Kitchen amLODipine (NORVASC) 10 MG tablet Take 1 tablet (10 mg total) by mouth daily.  . Cholecalciferol (VITAMIN D3) 2000 units TABS Take by mouth.  . fenofibrate 160 MG tablet Take 1 tablet (160 mg total) by mouth daily.  Marland Kitchen ibuprofen (ADVIL,MOTRIN) 800 MG tablet Take 800 mg by mouth as needed.   Marland Kitchen lisinopril-hydrochlorothiazide (PRINZIDE,ZESTORETIC) 20-12.5 MG tablet  Take 1 tablet by mouth daily.  . metoprolol succinate (TOPROL-XL) 25 MG 24 hr tablet Take 1 tablet (25 mg total) by mouth daily.  . nicotine (NICODERM CQ - DOSED IN MG/24 HOURS) 21 mg/24hr patch Place 1 patch (21 mg total) onto the skin daily. X 6 weeks  . omeprazole (PRILOSEC) 40 MG capsule Take 1 capsule (40 mg total) by mouth daily. On empty stomach  . traMADol (ULTRAM) 50 MG tablet Take by mouth daily as needed (left knee pain).  . [DISCONTINUED] amLODipine (NORVASC) 10 MG tablet Take 10 mg by mouth.  . [DISCONTINUED] fenofibrate 160 MG tablet Take 160 mg by mouth.  . [DISCONTINUED] gabapentin (NEURONTIN) 100 MG capsule take 2 capsules by mouth three times a day  . [DISCONTINUED] lisinopril-hydrochlorothiazide (PRINZIDE,ZESTORETIC) 20-12.5 MG tablet Take by mouth.  . [DISCONTINUED] metoprolol succinate (TOPROL-XL) 25 MG 24 hr tablet Take 25 mg by mouth.  . [DISCONTINUED] nicotine (NICODERM CQ - DOSED IN  MG/24 HOURS) 21 mg/24hr patch place 1 patch ON THE SKIN daily  . [DISCONTINUED] omeprazole (PRILOSEC) 40 MG capsule Take 40 mg by mouth.  . [DISCONTINUED] RA COL-RITE 100 MG capsule Take 100 mg by mouth 2 (two) times daily.  . [DISCONTINUED] sildenafil (VIAGRA) 100 MG tablet Take 100 mg by mouth daily as needed for erectile dysfunction.   No Known Allergies Recent Results (from the past 2160 hour(s))  Basic metabolic panel     Status: Abnormal   Collection Time: 08/30/17  2:46 PM  Result Value Ref Range   Sodium 134 (L) 135 - 145 mmol/L   Potassium 3.8 3.5 - 5.1 mmol/L   Chloride 97 (L) 101 - 111 mmol/L   CO2 25 22 - 32 mmol/L   Glucose, Bld 172 (H) 65 - 99 mg/dL   BUN 18 6 - 20 mg/dL   Creatinine, Ser 1.11 0.61 - 1.24 mg/dL   Calcium 9.4 8.9 - 10.3 mg/dL   GFR calc non Af Amer >60 >60 mL/min   GFR calc Af Amer >60 >60 mL/min    Comment: (NOTE) The eGFR has been calculated using the CKD EPI equation. This calculation has not been validated in all clinical situations. eGFR's persistently <60 mL/min signify possible Chronic Kidney Disease.    Anion gap 12 5 - 15  CBC     Status: Abnormal   Collection Time: 08/30/17  2:46 PM  Result Value Ref Range   WBC 8.8 3.8 - 10.6 K/uL   RBC 4.47 4.40 - 5.90 MIL/uL   Hemoglobin 13.6 13.0 - 18.0 g/dL   HCT 39.5 (L) 40.0 - 52.0 %   MCV 88.3 80.0 - 100.0 fL   MCH 30.4 26.0 - 34.0 pg   MCHC 34.4 32.0 - 36.0 g/dL   RDW 13.4 11.5 - 14.5 %   Platelets 203 150 - 440 K/uL  Troponin I     Status: None   Collection Time: 08/30/17  2:46 PM  Result Value Ref Range   Troponin I <0.03 <0.03 ng/mL  Blood gas, venous     Status: None   Collection Time: 08/30/17  5:55 PM  Result Value Ref Range   FIO2 0.21    pH, Ven 7.32 7.250 - 7.430   pCO2, Ven 51 44.0 - 60.0 mmHg   pO2, Ven UNABLE TO CALCULATE. 32.0 - 45.0 mmHg   Bicarbonate 26.3 20.0 - 28.0 mmol/L   Acid-base deficit 0.6 0.0 - 2.0 mmol/L   O2 Saturation UNABLE TO CALCULATE. %   Patient  temperature 37.0    Collection site VENOUS    Sample type VENOUS   Hepatic function panel     Status: None   Collection Time: 10/06/17  9:57 AM  Result Value Ref Range   Total Bilirubin 0.5 0.2 - 1.2 mg/dL   Bilirubin, Direct 0.1 0.0 - 0.3 mg/dL   Alkaline Phosphatase 62 39 - 117 U/L   AST 23 0 - 37 U/L   ALT 20 0 - 53 U/L   Total Protein 7.2 6.0 - 8.3 g/dL   Albumin 4.7 3.5 - 5.2 g/dL  Hemoglobin A1c     Status: None   Collection Time: 10/06/17  9:57 AM  Result Value Ref Range   Hgb A1c MFr Bld 6.0 4.6 - 6.5 %    Comment: Glycemic Control Guidelines for People with Diabetes:Non Diabetic:  <6%Goal of Therapy: <7%Additional Action Suggested:  >8%   Lipid panel     Status: Abnormal   Collection Time: 10/06/17  9:57 AM  Result Value Ref Range   Cholesterol 177 0 - 200 mg/dL    Comment: ATP III Classification       Desirable:  < 200 mg/dL               Borderline High:  200 - 239 mg/dL          High:  > = 240 mg/dL   Triglycerides 194.0 (H) 0.0 - 149.0 mg/dL    Comment: Normal:  <150 mg/dLBorderline High:  150 - 199 mg/dL   HDL 57.20 >39.00 mg/dL   VLDL 38.8 0.0 - 40.0 mg/dL   LDL Cholesterol 81 0 - 99 mg/dL   Total CHOL/HDL Ratio 3     Comment:                Men          Women1/2 Average Risk     3.4          3.3Average Risk          5.0          4.42X Average Risk          9.6          7.13X Average Risk          15.0          11.0                       NonHDL 119.77     Comment: NOTE:  Non-HDL goal should be 30 mg/dL higher than patient's LDL goal (i.e. LDL goal of < 70 mg/dL, would have non-HDL goal of < 100 mg/dL)  Urinalysis, Routine w reflex microscopic     Status: None   Collection Time: 10/06/17  9:57 AM  Result Value Ref Range   Color, Urine YELLOW Yellow;Lt. Yellow   APPearance CLEAR Clear   Specific Gravity, Urine 1.020 1.000 - 1.030   pH 6.0 5.0 - 8.0   Total Protein, Urine NEGATIVE Negative   Urine Glucose NEGATIVE Negative   Ketones, ur NEGATIVE Negative    Bilirubin Urine NEGATIVE Negative   Hgb urine dipstick NEGATIVE Negative   Urobilinogen, UA 0.2 0.0 - 1.0   Leukocytes, UA NEGATIVE Negative   Nitrite NEGATIVE Negative   WBC, UA none seen 0-2/hpf   RBC / HPF none seen 0-2/hpf  Hepatitis B surface antigen     Status: None   Collection Time: 10/06/17  9:57 AM  Result  Value Ref Range   Hepatitis B Surface Ag NON-REACTIVE NON-REACTI  Hepatitis B surface antibody     Status: None   Collection Time: 10/06/17  9:57 AM  Result Value Ref Range   Hepatitis B-Post 521 > OR = 10 mIU/mL    Comment: . Patient has immunity to hepatitis B virus. . For additional information, please refer to http://education.questdiagnostics.com/faq/FAQ105 (This link is being provided for informational/ educational purposes only).   Hepatitis C antibody     Status: None   Collection Time: 10/06/17  9:57 AM  Result Value Ref Range   Hepatitis C Ab NON-REACTIVE NON-REACTI   SIGNAL TO CUT-OFF 0.01 <1.00   Objective  Body mass index is 40.99 kg/m. Wt Readings from Last 3 Encounters:  11/06/17 277 lb 9.6 oz (125.9 kg)  10/06/17 277 lb 6 oz (125.8 kg)  08/30/17 260 lb (117.9 kg)   Temp Readings from Last 3 Encounters:  11/06/17 98.1 F (36.7 C) (Oral)  10/06/17 97.9 F (36.6 C) (Oral)  08/30/17 97.6 F (36.4 C)   BP Readings from Last 3 Encounters:  11/06/17 140/70  10/06/17 128/68  08/30/17 (!) 148/79   Pulse Readings from Last 3 Encounters:  11/06/17 78  10/06/17 70  08/30/17 77   O2 sat room air 97% Physical Exam  Constitutional: He is oriented to person, place, and time and well-developed, well-nourished, and in no distress. Vital signs are normal.  HENT:  Head: Normocephalic and atraumatic.  Mouth/Throat: Oropharynx is clear and moist and mucous membranes are normal.  Eyes: Conjunctivae are normal. Pupils are equal, round, and reactive to light.  Cardiovascular: Normal rate, regular rhythm and normal heart sounds.  Pulmonary/Chest:  Effort normal.  Course breath sounds on exam   Neurological: He is alert and oriented to person, place, and time. Gait normal. Gait normal.  Skin: Skin is warm, dry and intact.  Psychiatric: Mood, memory, affect and judgment normal.  Nursing note and vitals reviewed.   Assessment   1. HTN controlled, HLD, prediabetes 2. Dysphagia and hoarseness, h/o OSA not able to tolerate cpap in past  3. Tobacco abuse  4. HM Plan  1.  Refilled meds norvasc 10, Lis-hct 20-12.5, toprol XL 25 mg qd   Rx fenofibrate 160 mg qd  rec exercise to lose wt healthy diet choicse  2.  Refer to [facility] ENT Tami Ribas preferred then Vaught 3.  rec smoking cessation  Rx nicoderm patches  4. Had flu shot  Need to confirm other vaccines with prev PCP Dr. 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 Carolanne Grumbling in [facility]  -did not have MRI or colonscopy report and rec contact GI  -GI is Dr. Fransico Meadow II fax # [phone] per his notes had colonoscopy 08, 15 h/o gastritis, peptic ulcer he eval 08/24/14 for 3rd degree hemorrhoids, fecal incontinence and referred to Dr. Donzetta Starch for hemorrhoidectomy   -iFOB neg 07/28/16  -PSA 1.7 12/26/16, TSH 1.890  12/26/16, A1C 5.8 12/26/16 -sleep study had 11/08/11 + then titration study 11/22/11 cpap 13 cm full face mask but did well at pressure of 10 cm O2 sat 95%   Rec smoking cessation. Smoker on and off since age 31s has quit x 4 years previously smokes >1ppd or more no FH lung cancer    Provider: Dr. Olivia Mackie McLean-Scocuzza-Internal Medicine

## 2017-11-13 ENCOUNTER — Encounter: Payer: Self-pay | Admitting: Internal Medicine

## 2017-11-18 ENCOUNTER — Encounter: Payer: Self-pay | Admitting: Internal Medicine

## 2017-11-20 ENCOUNTER — Encounter: Payer: Self-pay | Admitting: Internal Medicine

## 2017-11-20 ENCOUNTER — Other Ambulatory Visit: Payer: Self-pay | Admitting: Internal Medicine

## 2017-11-20 DIAGNOSIS — E781 Pure hyperglyceridemia: Secondary | ICD-10-CM

## 2017-11-20 MED ORDER — GEMFIBROZIL 600 MG PO TABS: 600.0000 mg | ORAL_TABLET | Freq: Two times a day (BID) | ORAL | 3 refills | Status: DC

## 2017-11-30 ENCOUNTER — Encounter: Payer: Self-pay | Admitting: Internal Medicine

## 2017-12-03 ENCOUNTER — Other Ambulatory Visit: Payer: Self-pay | Admitting: Unknown Physician Specialty

## 2017-12-03 DIAGNOSIS — R1312 Dysphagia, oropharyngeal phase: Secondary | ICD-10-CM

## 2017-12-29 ENCOUNTER — Ambulatory Visit
Admission: RE | Admit: 2017-12-29 | Discharge: 2017-12-29 | Disposition: A | Discharge status: Home / Self Care | Payer: Medicare HMO | Source: Ambulatory Visit | Attending: Unknown Physician Specialty | Admitting: Unknown Physician Specialty

## 2017-12-29 DIAGNOSIS — R1314 Dysphagia, pharyngoesophageal phase: Secondary | ICD-10-CM

## 2017-12-29 DIAGNOSIS — R1312 Dysphagia, oropharyngeal phase: Secondary | ICD-10-CM

## 2017-12-29 DIAGNOSIS — R1310 Dysphagia, unspecified: Secondary | ICD-10-CM | POA: Diagnosis not present

## 2017-12-29 NOTE — Therapy (Addendum)
[place], [place], [address] Phone: [phone]   Fax:     Modified Barium Swallow  Patient Details  Name: Samuel Mccormick MRN: [id number] Date of Birth: 10/05/1950 No data recorded  Encounter Date: 12/29/2017  End of Session - 12/29/17 1617    Visit Number  1    Number of Visits  1    Date for SLP Re-Evaluation  12/29/17    SLP Start Time  95    SLP Stop Time   1400    SLP Time Calculation (min)  60 min    Activity Tolerance  Patient tolerated treatment well       Past Medical History:  Diagnosis Date  . Arthritis   . Chicken pox   . Colon polyps   . Depression   . Hyperlipidemia   . Hypertension   . Squamous cell skin cancer     Past Surgical History:  Procedure Laterality Date  . SKIN BIOPSY      There were no vitals filed for this visit.       Subjective: Patient behavior: (alertness, ability to follow instructions, etc.): pleasant, no natural dentition. Dentures. Pt has tobacco use and does have a dx of GERD on a PPI per chart note.  Chief complaint: dysphagia. Pt has been seen by ENT. Pt has c/o dysphagia noted in the throat, per ENT. Pt described to SLP mainly c/o a gradual onset of these s/s for ~1 year, dry cough after meals, and a Globus feeling during meals. Pt also described becoming "choked" on cornbread last December. He reports s/s ~3x week during meals.    Objective:  Radiological Procedure: A videoflouroscopic evaluation of oral-preparatory, reflex initiation, and pharyngeal phases of the swallow was performed; as well as a screening of the upper esophageal phase.  I. POSTURE: upright II. VIEW: lateral views III. COMPENSATORY STRATEGIES: time b/t trials IV. BOLUSES ADMINISTERED:  Thin Liquid: 4 trials  Nectar-thick Liquid: 1 trial  Honey-thick Liquid: NT  Puree: 3 trials  Mechanical Soft: 2 trials V. RESULTS OF EVALUATION: A. ORAL PREPARATORY PHASE: (The  lips, tongue, and velum are observed for strength and coordination)       **Overall Severity Rating: WFL.  Timely bolus management for mastication and A-P transfer; adequate oral clearing post swallowing.  B. SWALLOW INITIATION/REFLEX: (The reflex is normal if "triggered" by the time the bolus reached the base of the tongue)  **Overall Severity Rating: [facility]. Timing of the pharyngeal swallow was [facility] adequate w/ all bolus consistencies. Adequate airway protection, closure during swallowing. No aspiration occurred during bolus trials.  C. PHARYNGEAL PHASE: (Pharyngeal function is normal if the bolus shows rapid, smooth, and continuous transit through the pharynx and there is no pharyngeal residue after the swallow)  **Overall Severity Rating: [facility]. No significant pharyngeal residue noted post bolus swallowing w/ any consistency. Laryngeal excursion and pharyngeal pressure during the swallow appeared appropriate and adequate.  D. LARYNGEAL PENETRATION: (Material entering into the laryngeal inlet/vestibule but not aspirated): NONE  E. ASPIRATION: NONE F. ESOPHAGEAL PHASE: (Screening of the upper esophagus): during a sweep of the mid-Esophagus; min Esophageal dysmotility w/ slow clearing of the bolus material was noted - time b/t boluses appeared to aid the Esophageal clearing    ASSESSMENT: Pt appeared to present w/ adequate oropharyngeal phase swallow function w/ no obvious oropharyngeal phase dysphagia during this exam. During the Esophageal phase, no overt dysmotility noted in the Cervical Esophagus viewable - no  impedence noted in the Cervical Esophagus. However, during a sweep of the mid-Esophagus; min Esophageal dysmotility w/ slow clearing of the bolus material was noted - time b/t boluses appeared to aid the Esophageal clearing. During the pharyngeal phase, no significant pharyngeal residue noted post bolus swallowing w/ any consistency. Laryngeal excursion and pharyngeal pressure during the swallow  appeared appropriate and adequate. Timing of the pharyngeal swallow was [facility] adequate w/ all bolus consistencies. Adequate airway protection, closure during swallowing. No aspiration occurred during bolus trials. During the oral phase, timely bolus management for mastication and A-P transfer; adequate oral clearing post swallowing. Recommend f/u w/ GI for further Esophageal management. Pt's apparent slower Esophageal clearing may be related to his c/o Globus feeling in the throat, dry cough post meals, and the gradual nature of his dysphagia complaints. Education on above and need for full mastication of solids/moistening solids/small bites of solids was discussed w/ pt, handouts given.    PLAN/RECOMMENDATIONS:  A. Diet: regular w/ meats cut small, moistened foods; thin liquids. Pills in puree - Whole or in liquid and chewable forms, if easier for swallowing  B. Swallowing Precautions: general aspiration precautions; REFLUX precautions  C. Recommended consultation to: GI for further assessment/management of Esophageal dysmotility and Reflux management(PPI)/education  D. Therapy recommendations: None  E. Results and recommendations were discussed w/ pt, questions answered; video viewed together          Dysphagia, pharyngoesophageal phase  Oropharyngeal dysphagia - Plan: DG OP Swallowing Func-Medicare/Speech Path, DG OP Swallowing Func-Medicare/Speech Path        Problem List Patient Active Problem List   Diagnosis Date Noted  . HLD (hyperlipidemia) 11/09/2017  . OSA (obstructive sleep apnea) 11/09/2017  . Hepatic steatosis 10/07/2017  . HTN (hypertension) 10/07/2017  . Prediabetes 10/07/2017  . Morbid obesity ([place]) 10/07/2017  . Dysphagia 10/07/2017  . Hoarseness 10/07/2017  . Tobacco abuse 10/07/2017  . BPH (benign prostatic hyperplasia) 10/07/2017  . Knee pain, left 10/07/2017  . Normocytic anemia 10/07/2017      Orinda Kenner, MS,  CCC-SLP Cylie Dor 12/29/2017, 4:17 PM  [facility] [place], [place], [address] Phone: [phone]   Fax:     Name: Olof Marcil MRN: [id number] Date of Birth: [birth year]

## 2018-01-23 ENCOUNTER — Encounter: Payer: Self-pay | Admitting: Internal Medicine

## 2018-01-25 ENCOUNTER — Other Ambulatory Visit: Payer: Self-pay | Admitting: Internal Medicine

## 2018-01-25 DIAGNOSIS — I10 Essential (primary) hypertension: Secondary | ICD-10-CM

## 2018-01-25 MED ORDER — LISINOPRIL-HYDROCHLOROTHIAZIDE 20-12.5 MG PO TABS: 2.0000 | ORAL_TABLET | Freq: Every day | ORAL | 1 refills | Status: DC

## 2018-02-03 ENCOUNTER — Ambulatory Visit (INDEPENDENT_AMBULATORY_CARE_PROVIDER_SITE_OTHER): Payer: Medicare HMO | Admitting: Internal Medicine

## 2018-02-03 ENCOUNTER — Encounter: Payer: Self-pay | Admitting: Internal Medicine

## 2018-02-03 ENCOUNTER — Other Ambulatory Visit: Payer: Self-pay

## 2018-02-03 VITALS — BP 140/70 | HR 71 | Temp 98.2°F | Ht 68.0 in | Wt 281.6 lb

## 2018-02-03 DIAGNOSIS — R7303 Prediabetes: Secondary | ICD-10-CM | POA: Diagnosis not present

## 2018-02-03 DIAGNOSIS — I10 Essential (primary) hypertension: Secondary | ICD-10-CM | POA: Diagnosis not present

## 2018-02-03 DIAGNOSIS — Z720 Tobacco use: Secondary | ICD-10-CM

## 2018-02-03 DIAGNOSIS — J449 Chronic obstructive pulmonary disease, unspecified: Secondary | ICD-10-CM | POA: Insufficient documentation

## 2018-02-03 DIAGNOSIS — L570 Actinic keratosis: Secondary | ICD-10-CM | POA: Diagnosis not present

## 2018-02-03 DIAGNOSIS — K219 Gastro-esophageal reflux disease without esophagitis: Secondary | ICD-10-CM | POA: Diagnosis not present

## 2018-02-03 DIAGNOSIS — Z85828 Personal history of other malignant neoplasm of skin: Secondary | ICD-10-CM | POA: Insufficient documentation

## 2018-02-03 DIAGNOSIS — Z1329 Encounter for screening for other suspected endocrine disorder: Secondary | ICD-10-CM

## 2018-02-03 DIAGNOSIS — E781 Pure hyperglyceridemia: Secondary | ICD-10-CM

## 2018-02-03 DIAGNOSIS — R1310 Dysphagia, unspecified: Secondary | ICD-10-CM | POA: Diagnosis not present

## 2018-02-03 DIAGNOSIS — E785 Hyperlipidemia, unspecified: Secondary | ICD-10-CM

## 2018-02-03 DIAGNOSIS — G8929 Other chronic pain: Secondary | ICD-10-CM

## 2018-02-03 DIAGNOSIS — M25562 Pain in left knee: Secondary | ICD-10-CM | POA: Diagnosis not present

## 2018-02-03 DIAGNOSIS — Z125 Encounter for screening for malignant neoplasm of prostate: Secondary | ICD-10-CM

## 2018-02-03 MED ORDER — METOPROLOL SUCCINATE ER 25 MG PO TB24: 25.0000 mg | ORAL_TABLET | Freq: Every day | ORAL | 3 refills | Status: DC

## 2018-02-03 MED ORDER — GLUCOSE BLOOD VI STRP: ORAL_STRIP | 3 refills | Status: AC

## 2018-02-03 MED ORDER — TRAMADOL HCL 50 MG PO TABS: 50.0000 mg | ORAL_TABLET | Freq: Every day | ORAL | 2 refills | Status: DC | PRN

## 2018-02-03 MED ORDER — GEMFIBROZIL 600 MG PO TABS: 600.0000 mg | ORAL_TABLET | Freq: Two times a day (BID) | ORAL | 3 refills | Status: DC

## 2018-02-03 MED ORDER — AMLODIPINE BESYLATE 10 MG PO TABS: 10.0000 mg | ORAL_TABLET | Freq: Every day | ORAL | 3 refills | Status: DC

## 2018-02-03 MED ORDER — OMEPRAZOLE 40 MG PO CPDR: 40.0000 mg | DELAYED_RELEASE_CAPSULE | Freq: Every day | ORAL | 13 refills | Status: DC

## 2018-02-03 NOTE — Patient Instructions (Addendum)
I will refer you to dermatology Dr. De Burrs  Labs fasting mid after 6/16 no food only water x 12 hours  Please check on vaccines and let me know which one you have had  F/u in 3--4 months sooner if needed   Umeclidinium; Vilanterol inhalation powder What is this medicine? UMECLIDINIUM; VILANTEROL (ue MEK li DIN ee um; vye LAN ter ol) inhalation is a combination of two medicines that decrease inflammation and help to open up the airways of your lungs. It is for chronic obstructive pulmonary disease (COPD), including chronic bronchitis or emphysema. Do NOT use for asthma or an acute asthma attack. Do NOT use for a COPD attack. This medicine may be used for other purposes; ask your health care provider or pharmacist if you have questions. COMMON BRAND NAME(S): ANORO ELLIPTA What should I tell my health care provider before I take this medicine? They need to know if you have any of these conditions: -bladder problems or difficulty passing urine -diabetes -glaucoma -heart disease or irregular heartbeat -high blood pressure -kidney disease -pheochromocytoma -prostate disease -seizures -thyroid disease -an unusual or allergic reaction to umeclidinium, vilanterol, lactose, milk proteins, other medicines, foods, dyes, or preservatives -pregnant or trying to get pregnant -breast-feeding How should I use this medicine? This medicine is inhaled through the mouth. It is used once per day. Follow the directions on the prescription label. Do not use a spacer device with this inhaler. Take your medicine at regular intervals. Do not take your medicine more often than directed. Do not stop taking except on your doctor's advice. Make sure that you are using your inhaler correctly. Ask you doctor or health care provider if you have any questions. A special MedGuide will be given to you by the pharmacist with each prescription and refill. Be sure to read this information carefully each time. Talk to  your pediatrician regarding the use of this medicine in children. Special care may be needed. Overdosage: If you think you have taken too much of this medicine contact a poison control center or emergency room at once. NOTE: This medicine is only for you. Do not share this medicine with others. What if I miss a dose? If you miss a dose, use it as soon as you can. If it is almost time for your next dose, use only that dose and continue with your regular schedule. Do not use double or extra doses. What may interact with this medicine? Do not take this medicine with any of the following medications: -cisapride -dofetilide -dronedarone -MAOIs like Carbex, Eldepryl, Marplan, Nardil, and Parnate -pimozide -thioridazine -ziprasidone This medicine may also interact with the following medications: -antihistamines for allergy -antiviral medicines for HIV or AIDS -atropine -beta-blockers like metoprolol and propranolol -certain medicines for bladder problems like oxybutynin, tolterodine -certain medicines for depression, anxiety, or psychotic disturbances -certain medicines for Parkinson's disease like benztropine, trihexyphenidyl -certain medicines for stomach problems like dicyclomine, hyoscyamine -certain medicines for travel sickness like scopolamine -diuretics -ipratropium -medicines for colds -medicines for fungal infections like ketoconazole and itraconazole -other medicines for breathing problems -other medicines that prolong the QT interval (cause an abnormal heart rhythm) -tiotropium This list may not describe all possible interactions. Give your health care provider a list of all the medicines, herbs, non-prescription drugs, or dietary supplements you use. Also tell them if you smoke, drink alcohol, or use illegal drugs. Some items may interact with your medicine. What should I watch for while using this medicine? Visit your doctor or health  care professional for regular checkups.  Tell your doctor or health care professional if your symptoms do not get better. If your symptoms get worse or if you need your short-acting inhalers more often, call your doctor right away. Do not use this medicine more than once every 24 hours. What side effects may I notice from receiving this medicine? Side effects that you should report to your doctor or health care professional as soon as possible: -allergic reactions like skin rash or hives, swelling of the face, lips, or tongue -breathing problems right after inhaling your medicine -changes in vision -chest pain -eye pain -fast, irregular heartbeat -feeling faint or lightheaded, falls -fever or chills -nausea, vomiting -trouble passing urine or change in the amount of urine Side effects that usually do not require medical attention (report to your doctor or health care professional if they continue or are bothersome): -constipation -cough -diarrhea -headache -muscle cramps -nervousness -sore throat -tremor This list may not describe all possible side effects. Call your doctor for medical advice about side effects. You may report side effects to FDA at 1-800-FDA-1088. Where should I keep my medicine? Keep out of the reach of children. Store at room temperature between 15 and 30 degrees C (59 and 86 degrees F). Store in a dry place away from direct heat or sunlight. Throw away 6 weeks after you remove the inhaler from the foil tray, or after the dose indicator reads 0, whichever comes first. Throw away any unopened packages after the expiration date. NOTE: This sheet is a summary. It may not cover all possible information. If you have questions about this medicine, talk to your doctor, pharmacist, or health care provider.  2018 Elsevier/Gold Standard (2016-08-04 13:44:47)

## 2018-02-03 NOTE — Progress Notes (Signed)
Chief Complaint  Patient presents with  . Follow-up   F/u  1. HTN controlled on Lis, norvasc, toprol  2. Prediabetes trying healthier lifestyles would like Rx test strips  3. Left knee pain chronic takes prn Tramadol 50 mg and would like refill pain contract signed today  4. Dysphagia f/u with ENT will get records    Review of Systems  Constitutional: Negative for weight loss.  HENT: Negative for hearing loss.   Eyes: Negative for blurred vision.  Respiratory: Positive for cough. Negative for shortness of breath.   Cardiovascular: Negative for chest pain.  Gastrointestinal: Negative for abdominal pain.  Musculoskeletal: Positive for joint pain. Negative for falls.  Skin: Negative for rash.  Neurological: Negative for headaches.  Psychiatric/Behavioral: Negative for depression.   Past Medical History:  Diagnosis Date  . Arthritis   . Chicken pox   . Colon polyps   . Depression   . Hyperlipidemia   . Hypertension   . Squamous cell skin cancer    Past Surgical History:  Procedure Laterality Date  . SKIN BIOPSY     Family History  Problem Relation Age of Onset  . Cancer Mother        breast  . Other Mother        brain ? cancer   . Arthritis Sister   . COPD Sister   . Diabetes Sister   . Heart disease Sister   . Hyperlipidemia Sister   . Hypertension Sister   . Mental illness Sister   . Mental retardation Sister    Social History   Socioeconomic History  . Marital status: Widowed    Spouse name: Not on file  . Number of children: Not on file  . Years of education: Not on file  . Highest education level: Not on file  Occupational History  . Not on file  Social Needs  . Financial resource strain: Not on file  . Food insecurity:    Worry: Not on file    Inability: Not on file  . Transportation needs:    Medical: Not on file    Non-medical: Not on file  Tobacco Use  . Smoking status: Current Every Day Smoker    Packs/day: 1.00    Types: Cigarettes  .  Smokeless tobacco: Never Used  Substance and Sexual Activity  . Alcohol use: Yes    Alcohol/week: 8.4 oz    Types: 14 Shots of liquor per week  . Drug use: No  . Sexual activity: Never  Lifestyle  . Physical activity:    Days per week: Not on file    Minutes per session: Not on file  . Stress: Not on file  Relationships  . Social connections:    Talks on phone: Not on file    Gets together: Not on file    Attends religious service: Not on file    Active member of club or organization: Not on file    Attends meetings of clubs or organizations: Not on file    Relationship status: Not on file  . Intimate partner violence:    Fear of current or ex partner: Not on file    Emotionally abused: Not on file    Physically abused: Not on file    Forced sexual activity: Not on file  Other Topics Concern  . Not on file  Social History Narrative   From area lived in [place] recently moved back home    Retired from working in [place]  1 son lives in [place]    Some college    Current Meds  Medication Sig  . albuterol (PROVENTIL HFA;VENTOLIN HFA) 108 (90 Base) MCG/ACT inhaler Inhale 1-2 puffs into the lungs every 6 (six) hours as needed for wheezing or shortness of breath.  Marland Kitchen amLODipine (NORVASC) 10 MG tablet Take 1 tablet (10 mg total) by mouth daily.  . Cholecalciferol (VITAMIN D3) 2000 units TABS Take by mouth.  Marland Kitchen gemfibrozil (LOPID) 600 MG tablet Take 1 tablet (600 mg total) by mouth 2 (two) times daily before a meal.  . ibuprofen (ADVIL,MOTRIN) 800 MG tablet Take 800 mg by mouth as needed.   Marland Kitchen lisinopril-hydrochlorothiazide (PRINZIDE,ZESTORETIC) 20-12.5 MG tablet Take 2 tablets by mouth daily.  . metoprolol succinate (TOPROL-XL) 25 MG 24 hr tablet Take 1 tablet (25 mg total) by mouth daily.  . nicotine (NICODERM CQ - DOSED IN MG/24 HOURS) 21 mg/24hr patch Place 1 patch (21 mg total) onto the skin daily. X 6 weeks  . nicotine (NICODERM CQ) 14 mg/24hr patch Place 1 patch  (14 mg total) onto the skin daily. Week 7 x 2 weeks  . nicotine (NICODERM CQ) 7 mg/24hr patch Place 1 patch (7 mg total) onto the skin daily. Week 9 x 3 weeks  . omeprazole (PRILOSEC) 40 MG capsule Take 1 capsule (40 mg total) by mouth daily. On empty stomach  . traMADol (ULTRAM) 50 MG tablet Take by mouth daily as needed (left knee pain).   No Known Allergies No results found for this or any previous visit (from the past 2160 hour(s)). Objective  Body mass index is 42.82 kg/m. Wt Readings from Last 3 Encounters:  02/03/18 281 lb 9.6 oz (127.7 kg)  11/06/17 277 lb 9.6 oz (125.9 kg)  10/06/17 277 lb 6 oz (125.8 kg)   Temp Readings from Last 3 Encounters:  02/03/18 98.2 F (36.8 C) (Oral)  11/06/17 98.1 F (36.7 C) (Oral)  10/06/17 97.9 F (36.6 C) (Oral)   BP Readings from Last 3 Encounters:  02/03/18 140/70  11/06/17 140/70  10/06/17 128/68   Pulse Readings from Last 3 Encounters:  02/03/18 71  11/06/17 78  10/06/17 70    Physical Exam  Constitutional: He is oriented to person, place, and time. Vital signs are normal. He appears well-developed and well-nourished. He is cooperative.  HENT:  Head: Normocephalic and atraumatic.  Mouth/Throat: Oropharynx is clear and moist and mucous membranes are normal.  Eyes: Pupils are equal, round, and reactive to light. Conjunctivae are normal.  Cardiovascular: Normal rate, regular rhythm and normal heart sounds.  Pulmonary/Chest: Effort normal. He has wheezes.  Neurological: He is alert and oriented to person, place, and time. Gait normal.  Skin: Skin is warm, dry and intact.  aks to arms and skin lesion to ab ? AK vs other   Psychiatric: He has a normal mood and affect. His speech is normal and behavior is normal. Judgment and thought content normal. Cognition and memory are normal.  Nursing note and vitals reviewed.   Assessment   1. HTN/HLD  2. Prediabetes  3. Left knee pain  4. Dysphagia improved  5. Aks, skin lesion ab  new  6. HM 7. Likely COPD Plan   1. Cont meds  2. Pt would like one touch ultra test strips Rx today  3. Tramadol pain contract signed today  4. Records ENT, pt to f/u with Dr. Allen Norris  5. Refer to Dr. Kellie Moor  6.  Had flu shot  ?  Other vaccines pt thinks pna 23 will get copy of vaccines  Labs tbd 02/28/18  Colonoscopy due 2020  PSA check with upcoming labs  CT chest due 08/2018  7. rec smoking cessation  Prn Albuterol  Consider anoro today   Provider: Dr. Olivia Mackie McLean-Scocuzza-Internal Medicine

## 2018-03-01 ENCOUNTER — Other Ambulatory Visit (INDEPENDENT_AMBULATORY_CARE_PROVIDER_SITE_OTHER): Payer: Medicare HMO

## 2018-03-01 DIAGNOSIS — I10 Essential (primary) hypertension: Secondary | ICD-10-CM | POA: Diagnosis not present

## 2018-03-01 DIAGNOSIS — Z1329 Encounter for screening for other suspected endocrine disorder: Secondary | ICD-10-CM

## 2018-03-01 DIAGNOSIS — Z125 Encounter for screening for malignant neoplasm of prostate: Secondary | ICD-10-CM | POA: Diagnosis not present

## 2018-03-01 DIAGNOSIS — E781 Pure hyperglyceridemia: Secondary | ICD-10-CM | POA: Diagnosis not present

## 2018-03-01 LAB — CBC WITH DIFFERENTIAL/PLATELET
BASOS ABS: 0 10*3/uL (ref 0.0–0.1)
BASOS PCT: 0.7 % (ref 0.0–3.0)
EOS PCT: 3.8 % (ref 0.0–5.0)
Eosinophils Absolute: 0.2 10*3/uL (ref 0.0–0.7)
HEMATOCRIT: 39.8 % (ref 39.0–52.0)
Hemoglobin: 13.8 g/dL (ref 13.0–17.0)
LYMPHS ABS: 2.5 10*3/uL (ref 0.7–4.0)
LYMPHS PCT: 38.8 % (ref 12.0–46.0)
MCHC: 34.6 g/dL (ref 30.0–36.0)
MCV: 87.8 fl (ref 78.0–100.0)
MONOS PCT: 10.1 % (ref 3.0–12.0)
Monocytes Absolute: 0.6 10*3/uL (ref 0.1–1.0)
NEUTROS ABS: 2.9 10*3/uL (ref 1.4–7.7)
NEUTROS PCT: 46.6 % (ref 43.0–77.0)
PLATELETS: 201 10*3/uL (ref 150.0–400.0)
RBC: 4.54 Mil/uL (ref 4.22–5.81)
RDW: 12.8 % (ref 11.5–15.5)
WBC: 6.3 10*3/uL (ref 4.0–10.5)

## 2018-03-01 LAB — LIPID PANEL
CHOL/HDL RATIO: 4
CHOLESTEROL: 169 mg/dL (ref 0–200)
HDL: 45.8 mg/dL (ref >39.00)
LDL Cholesterol: 86 mg/dL (ref 0–99)
NonHDL: 122.91
TRIGLYCERIDES: 183 mg/dL — AB (ref 0.0–149.0)
VLDL: 36.6 mg/dL (ref 0.0–40.0)

## 2018-03-01 LAB — COMPREHENSIVE METABOLIC PANEL
ALT: 32 U/L (ref 0–53)
AST: 20 U/L (ref 0–37)
Albumin: 4.6 g/dL (ref 3.5–5.2)
Alkaline Phosphatase: 69 U/L (ref 39–117)
BILIRUBIN TOTAL: 0.5 mg/dL (ref 0.2–1.2)
BUN: 17 mg/dL (ref 6–23)
CALCIUM: 10.1 mg/dL (ref 8.4–10.5)
CHLORIDE: 97 meq/L (ref 96–112)
CO2: 28 meq/L (ref 19–32)
Creatinine, Ser: 0.98 mg/dL (ref 0.40–1.50)
GFR: 80.98 mL/min (ref >60.00)
GLUCOSE: 99 mg/dL (ref 70–99)
POTASSIUM: 4.3 meq/L (ref 3.5–5.1)
Sodium: 136 mEq/L (ref 135–145)
Total Protein: 6.8 g/dL (ref 6.0–8.3)

## 2018-03-01 LAB — TSH: TSH: 1.38 u[IU]/mL (ref 0.35–4.50)

## 2018-03-01 LAB — PSA, MEDICARE: PSA: 1.78 ng/mL (ref 0.10–4.00)

## 2018-03-04 ENCOUNTER — Encounter: Payer: Self-pay | Admitting: Internal Medicine

## 2018-03-16 ENCOUNTER — Ambulatory Visit: Payer: Medicare HMO | Admitting: Gastroenterology

## 2018-04-08 ENCOUNTER — Telehealth: Payer: Self-pay | Admitting: Internal Medicine

## 2018-04-08 NOTE — Telephone Encounter (Signed)
Pt needs a refill on traMADol (ULTRAM) 50 MG tablet sent to [facility]  

## 2018-04-08 NOTE — Telephone Encounter (Signed)
Refill request for Tramadol, last seen 22MAY2019, last filled 22MAY2019.  Please advise.

## 2018-04-09 ENCOUNTER — Other Ambulatory Visit: Payer: Self-pay | Admitting: Internal Medicine

## 2018-04-09 DIAGNOSIS — M25562 Pain in left knee: Principal | ICD-10-CM

## 2018-04-09 DIAGNOSIS — G8929 Other chronic pain: Secondary | ICD-10-CM

## 2018-04-09 MED ORDER — TRAMADOL HCL 50 MG PO TABS: 50.0000 mg | ORAL_TABLET | Freq: Every day | ORAL | 2 refills | Status: DC | PRN

## 2018-04-09 NOTE — Telephone Encounter (Signed)
He has signed chronic pain contract for tramadol no c/w abuse   Corder

## 2018-04-30 ENCOUNTER — Encounter: Payer: Self-pay | Admitting: Internal Medicine

## 2018-05-11 ENCOUNTER — Encounter: Payer: Self-pay | Admitting: Internal Medicine

## 2018-05-11 ENCOUNTER — Ambulatory Visit (INDEPENDENT_AMBULATORY_CARE_PROVIDER_SITE_OTHER): Payer: Medicare HMO | Admitting: Internal Medicine

## 2018-05-11 ENCOUNTER — Ambulatory Visit (INDEPENDENT_AMBULATORY_CARE_PROVIDER_SITE_OTHER): Payer: Medicare HMO

## 2018-05-11 VITALS — BP 120/70 | HR 78 | Temp 98.2°F | Ht 68.0 in | Wt 256.6 lb

## 2018-05-11 DIAGNOSIS — E559 Vitamin D deficiency, unspecified: Secondary | ICD-10-CM

## 2018-05-11 DIAGNOSIS — F17219 Nicotine dependence, cigarettes, with unspecified nicotine-induced disorders: Secondary | ICD-10-CM

## 2018-05-11 DIAGNOSIS — M25511 Pain in right shoulder: Secondary | ICD-10-CM

## 2018-05-11 DIAGNOSIS — E291 Testicular hypofunction: Secondary | ICD-10-CM

## 2018-05-11 DIAGNOSIS — R400 Somnolence: Secondary | ICD-10-CM

## 2018-05-11 DIAGNOSIS — E781 Pure hyperglyceridemia: Secondary | ICD-10-CM

## 2018-05-11 DIAGNOSIS — I10 Essential (primary) hypertension: Secondary | ICD-10-CM | POA: Diagnosis not present

## 2018-05-11 DIAGNOSIS — K219 Gastro-esophageal reflux disease without esophagitis: Secondary | ICD-10-CM

## 2018-05-11 DIAGNOSIS — N529 Male erectile dysfunction, unspecified: Secondary | ICD-10-CM

## 2018-05-11 DIAGNOSIS — R5383 Other fatigue: Secondary | ICD-10-CM

## 2018-05-11 DIAGNOSIS — G8929 Other chronic pain: Secondary | ICD-10-CM

## 2018-05-11 DIAGNOSIS — M25562 Pain in left knee: Secondary | ICD-10-CM

## 2018-05-11 DIAGNOSIS — M542 Cervicalgia: Secondary | ICD-10-CM

## 2018-05-11 IMAGING — DX DG KNEE COMPLETE 4+V*L*
4 series · 4 of 4 positions shown · non-contrast
Comparison: None.

CLINICAL DATA: Left knee pain 5 years intermittently. No recent
injury.

EXAM:
LEFT KNEE - COMPLETE 4+ VIEW

[knee standing ap]
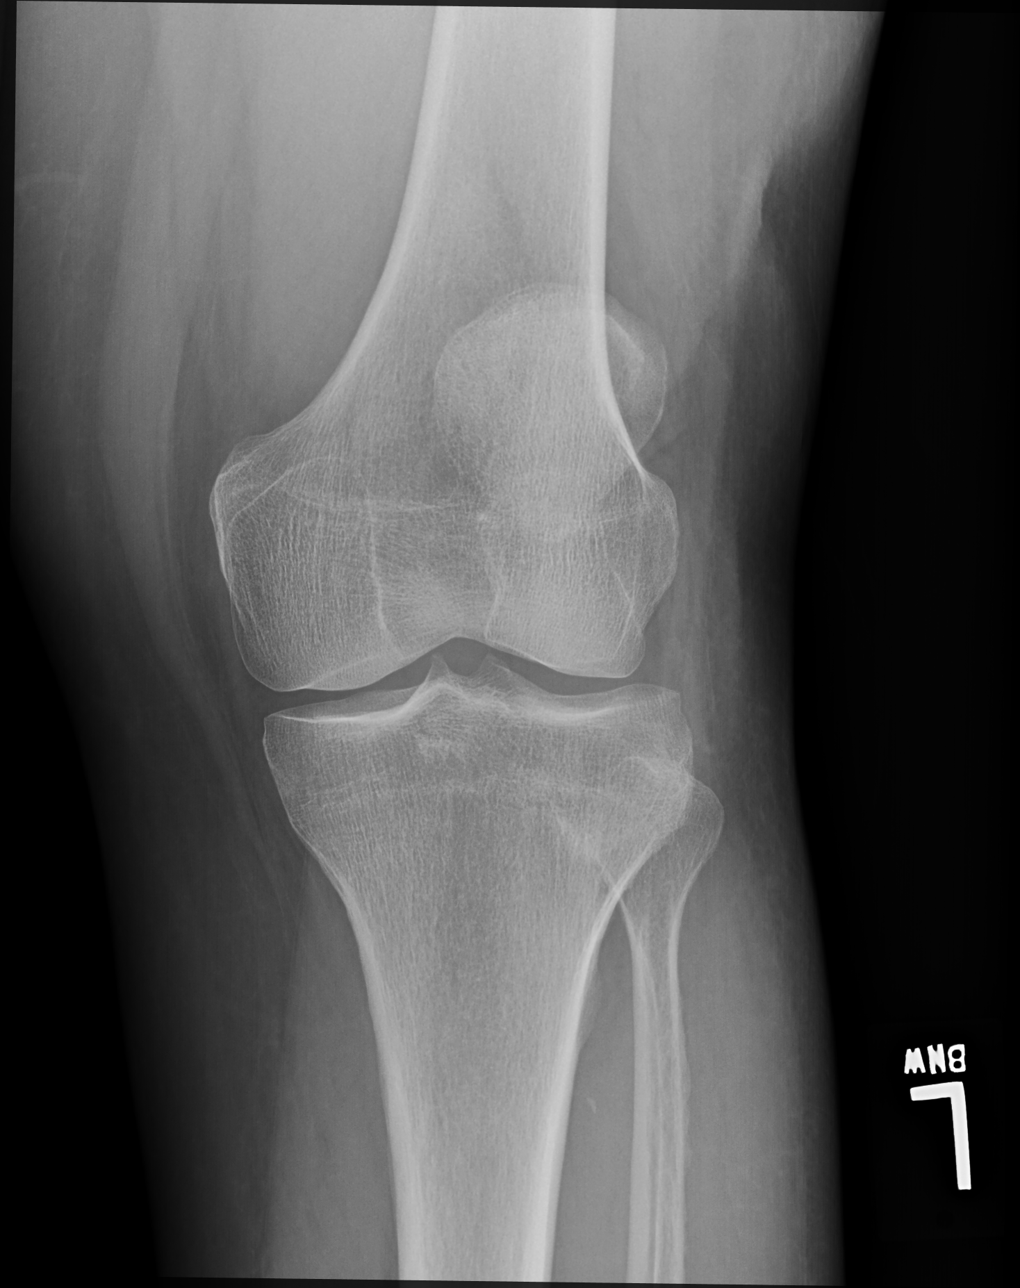

[knee standing external ap]
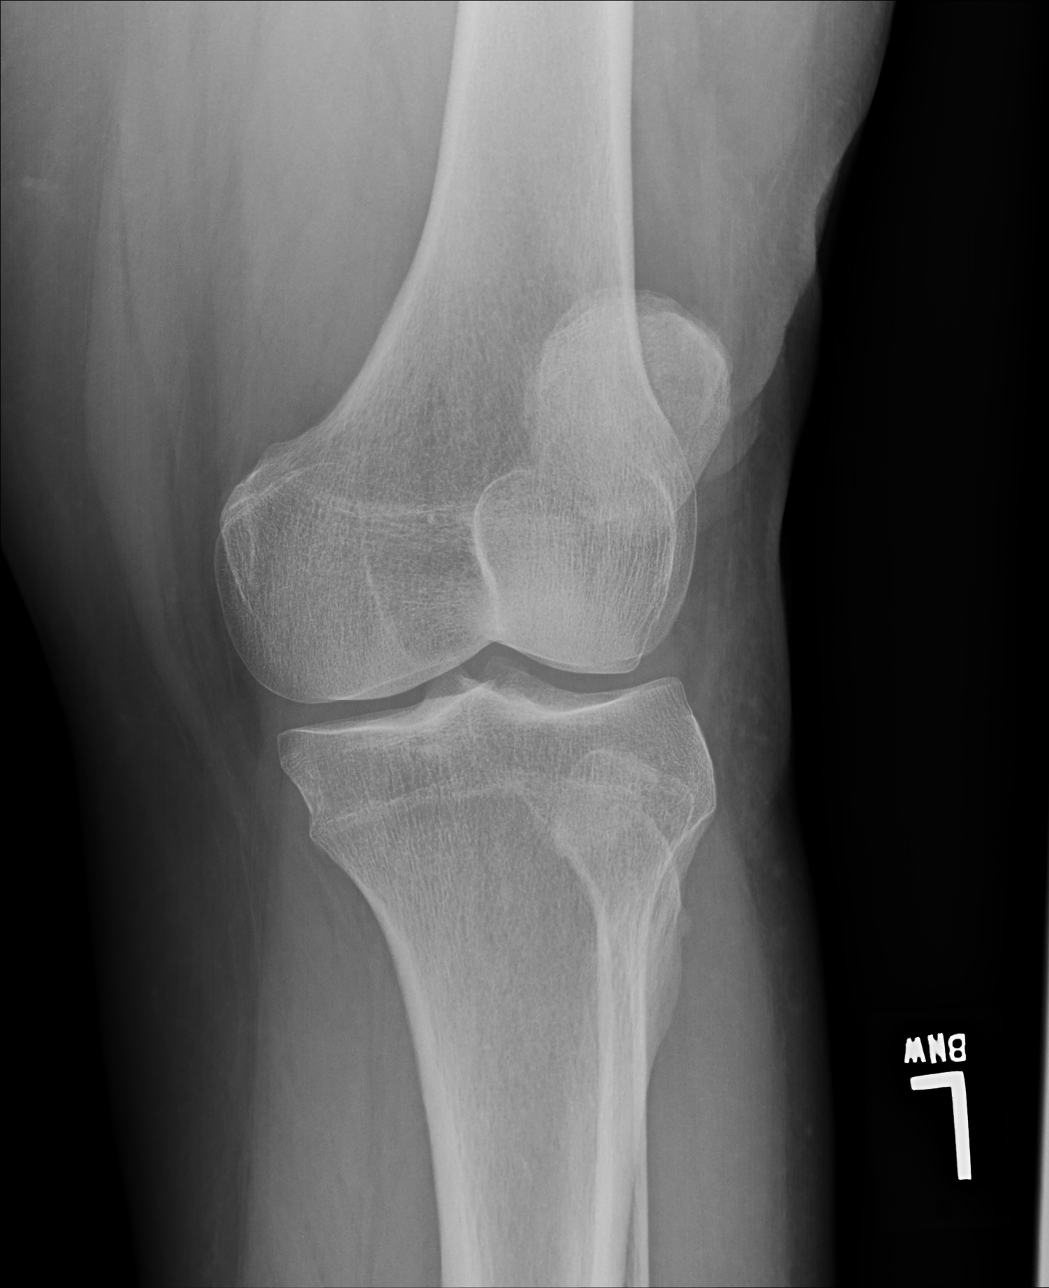

[knee standing internal ap]
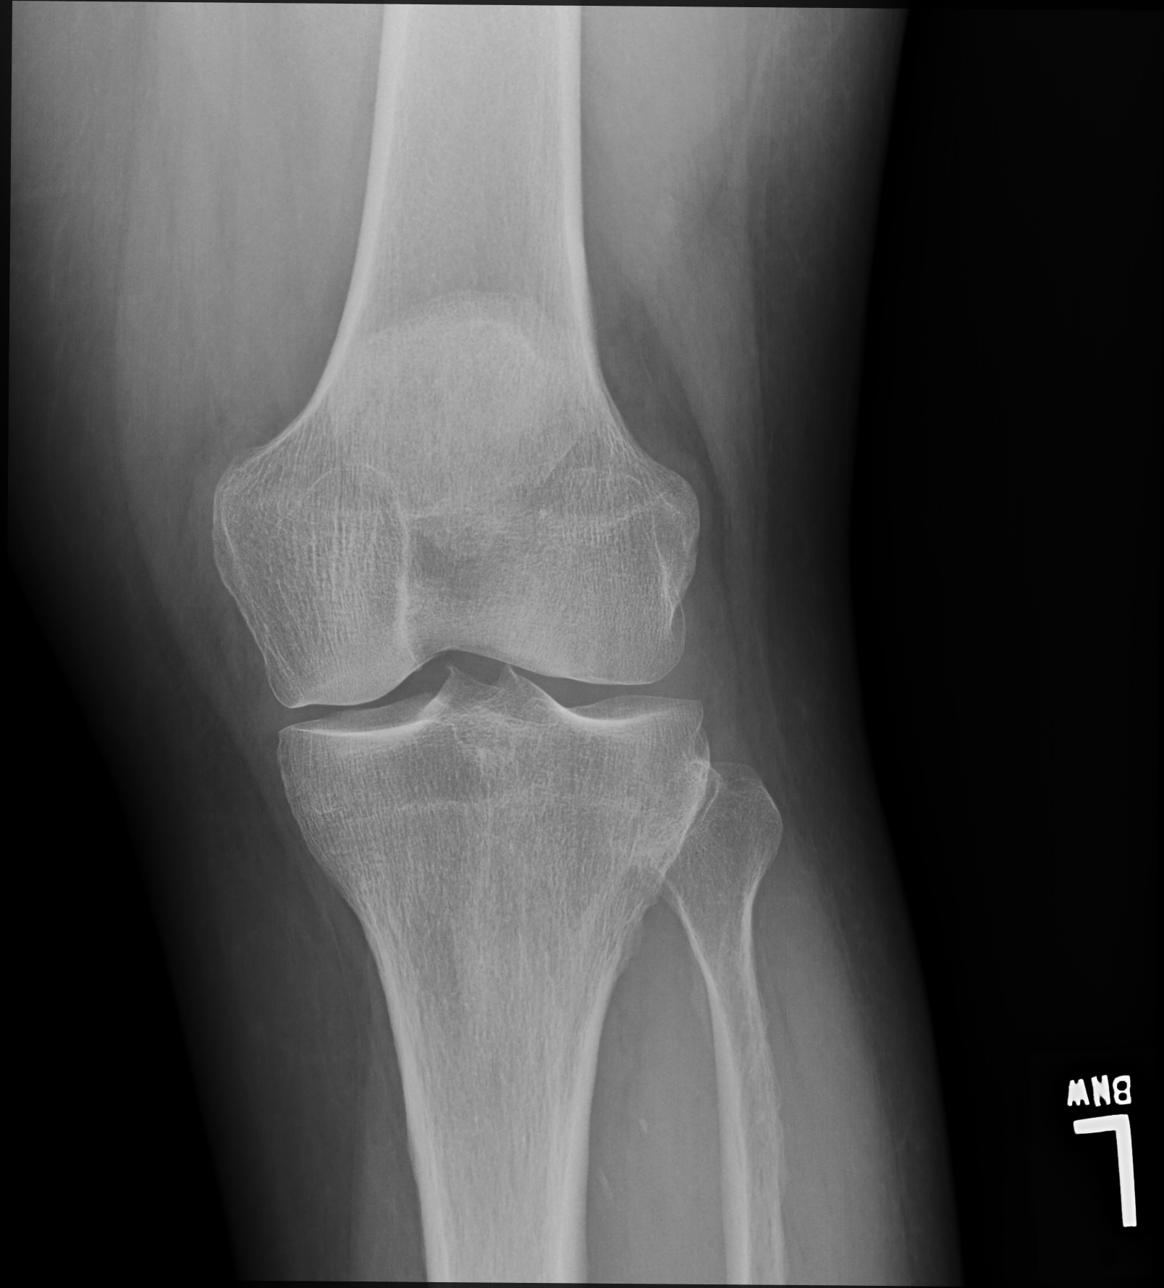

[knee standing lat]
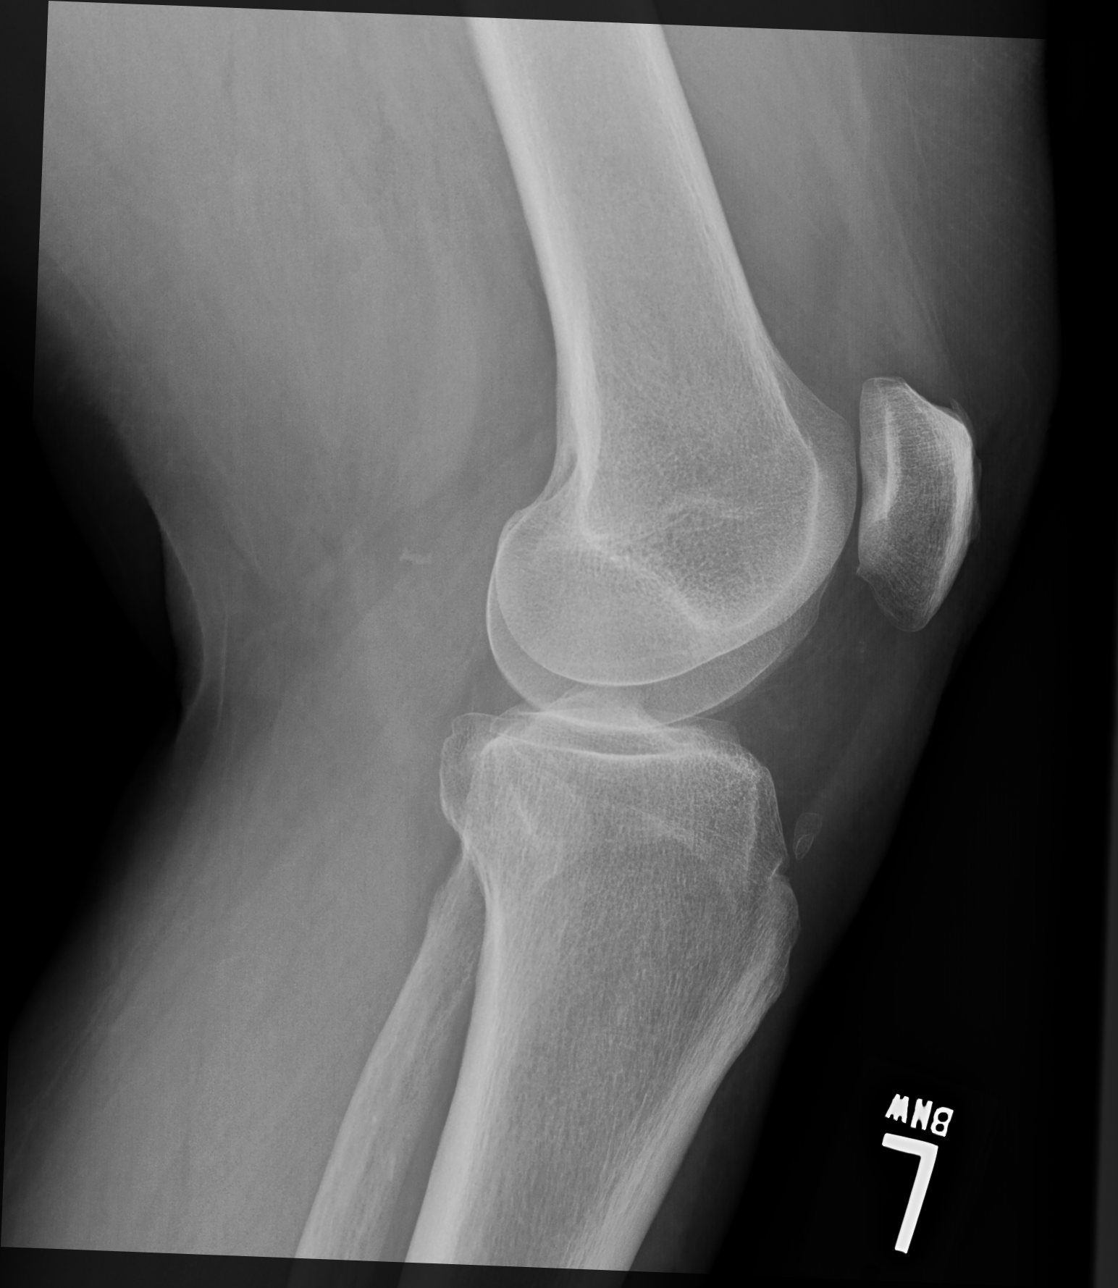

[4 of 4 positions shown; findings below may reference images not displayed]

FINDINGS: No evidence of fracture, dislocation, or joint effusion. No evidence
of arthropathy or other focal bone abnormality. Soft tissues are
unremarkable.
IMPRESSION: Negative.

## 2018-05-11 MED ORDER — OMEPRAZOLE 40 MG PO CPDR: 40.0000 mg | DELAYED_RELEASE_CAPSULE | Freq: Every day | ORAL | 13 refills | Status: DC

## 2018-05-11 MED ORDER — AMLODIPINE BESYLATE 10 MG PO TABS: 10.0000 mg | ORAL_TABLET | Freq: Every day | ORAL | 3 refills | Status: DC

## 2018-05-11 MED ORDER — TRAMADOL HCL 50 MG PO TABS: 50.0000 mg | ORAL_TABLET | Freq: Every day | ORAL | 2 refills | Status: AC | PRN

## 2018-05-11 MED ORDER — LISINOPRIL-HYDROCHLOROTHIAZIDE 20-12.5 MG PO TABS: 2.0000 | ORAL_TABLET | Freq: Every day | ORAL | 1 refills | Status: DC

## 2018-05-11 MED ORDER — NICOTINE 21 MG/24HR TD PT24: 21.0000 mg | MEDICATED_PATCH | Freq: Every day | TRANSDERMAL | 0 refills | Status: AC

## 2018-05-11 MED ORDER — SILDENAFIL CITRATE 100 MG PO TABS: 50.0000 mg | ORAL_TABLET | Freq: Every day | ORAL | 11 refills | Status: AC | PRN

## 2018-05-11 MED ORDER — NICOTINE 7 MG/24HR TD PT24: 7.0000 mg | MEDICATED_PATCH | Freq: Every day | TRANSDERMAL | 0 refills | Status: AC

## 2018-05-11 MED ORDER — SILDENAFIL CITRATE 20 MG PO TABS: 20.0000 mg | ORAL_TABLET | Freq: Every day | ORAL | 11 refills | Status: AC | PRN

## 2018-05-11 MED ORDER — METOPROLOL SUCCINATE ER 25 MG PO TB24: 25.0000 mg | ORAL_TABLET | Freq: Every day | ORAL | 3 refills | Status: DC

## 2018-05-11 MED ORDER — NICOTINE 14 MG/24HR TD PT24: 14.0000 mg | MEDICATED_PATCH | Freq: Every day | TRANSDERMAL | 0 refills | Status: AC

## 2018-05-11 MED ORDER — GEMFIBROZIL 600 MG PO TABS: 600.0000 mg | ORAL_TABLET | Freq: Two times a day (BID) | ORAL | 3 refills | Status: AC

## 2018-05-11 NOTE — Progress Notes (Signed)
Pre visit review using our clinic review tool, if applicable. No additional management support is needed unless otherwise documented below in the visit note. 

## 2018-05-11 NOTE — Progress Notes (Addendum)
Chief Complaint  Patient presents with  . Follow-up   F/u  1. C/o fatigue in daytime and ED. Not using CPAP does not like though has dx of OSA  2. Obesity has lost 20 lbs working out at gym  3. C/o neck pain CT neck 08/30/17 with disc spurring and DDD mild and right shoulder pain neck pain worse with ROM but mild and right shoulder pain with overhead motions lifting 40 # bench press at gym. Sx's x 1 month  4. Would like refills on all of meds some to [facility] others [facility]  5. Reviewed labs  6. htn controlled today    Review of Systems  Constitutional: Positive for weight loss.  HENT: Negative for hearing loss.   Eyes: Negative for blurred vision.  Respiratory: Negative for shortness of breath.   Cardiovascular: Negative for chest pain.  Gastrointestinal: Negative for abdominal pain.  Genitourinary:       +ED  Musculoskeletal: Positive for joint pain and neck pain.  Skin: Negative for rash.  Psychiatric/Behavioral:       +daytime sleepiness     Past Medical History:  Diagnosis Date  . Arthritis   . Chicken pox   . Colon polyps   . Depression   . Hyperlipidemia   . Hypertension   . Squamous cell skin cancer    Past Surgical History:  Procedure Laterality Date  . SKIN BIOPSY     Family History  Problem Relation Age of Onset  . Cancer Mother        breast  . Other Mother        brain ? cancer   . Arthritis Sister   . COPD Sister   . Diabetes Sister   . Heart disease Sister   . Hyperlipidemia Sister   . Hypertension Sister   . Mental illness Sister   . Mental retardation Sister    Social History   Socioeconomic History  . Marital status: Widowed    Spouse name: Not on file  . Number of children: Not on file  . Years of education: Not on file  . Highest education level: Not on file  Occupational History  . Not on file  Social Needs  . Financial resource strain: Not on file  . Food insecurity:    Worry: Not on file    Inability: Not on file  .  Transportation needs:    Medical: Not on file    Non-medical: Not on file  Tobacco Use  . Smoking status: Current Every Day Smoker    Packs/day: 1.00    Types: Cigarettes  . Smokeless tobacco: Never Used  Substance and Sexual Activity  . Alcohol use: Yes    Alcohol/week: 14.0 standard drinks    Types: 14 Shots of liquor per week  . Drug use: No  . Sexual activity: Never  Lifestyle  . Physical activity:    Days per week: Not on file    Minutes per session: Not on file  . Stress: Not on file  Relationships  . Social connections:    Talks on phone: Not on file    Gets together: Not on file    Attends religious service: Not on file    Active member of club or organization: Not on file    Attends meetings of clubs or organizations: Not on file    Relationship status: Not on file  . Intimate partner violence:    Fear of current or ex partner: Not on  file    Emotionally abused: Not on file    Physically abused: Not on file    Forced sexual activity: Not on file  Other Topics Concern  . 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 amLODipine (NORVASC) 10 MG tablet Take 1 tablet (10 mg total) by mouth daily.  . Cholecalciferol (VITAMIN D3) 2000 units TABS Take by mouth.  Marland Kitchen gemfibrozil (LOPID) 600 MG tablet Take 1 tablet (600 mg total) by mouth 2 (two) times daily before a meal.  . glucose blood test strip Use as instructed qd  . lisinopril-hydrochlorothiazide (PRINZIDE,ZESTORETIC) 20-12.5 MG tablet Take 2 tablets by mouth daily.  . metoprolol succinate (TOPROL-XL) 25 MG 24 hr tablet Take 1 tablet (25 mg total) by mouth daily.  . nicotine (NICODERM CQ - DOSED IN MG/24 HOURS) 21 mg/24hr patch Place 1 patch (21 mg total) onto the skin daily. X 6 weeks  . nicotine (NICODERM CQ) 14 mg/24hr patch Place 1 patch  (14 mg total) onto the skin daily. Week 7 x 2 weeks  . nicotine (NICODERM CQ) 7 mg/24hr patch Place 1 patch (7 mg total) onto the skin daily. Week 9 x 3 weeks  . omeprazole (PRILOSEC) 40 MG capsule Take 1 capsule (40 mg total) by mouth daily. On empty stomach  . traMADol (ULTRAM) 50 MG tablet Take 1 tablet (50 mg total) by mouth daily as needed (left knee pain).  . [DISCONTINUED] amLODipine (NORVASC) 10 MG tablet Take 1 tablet (10 mg total) by mouth daily.  . [DISCONTINUED] gemfibrozil (LOPID) 600 MG tablet Take 1 tablet (600 mg total) by mouth 2 (two) times daily before a meal.  . [DISCONTINUED] lisinopril-hydrochlorothiazide (PRINZIDE,ZESTORETIC) 20-12.5 MG tablet Take 2 tablets by mouth daily.  . [DISCONTINUED] metoprolol succinate (TOPROL-XL) 25 MG 24 hr tablet Take 1 tablet (25 mg total) by mouth daily.  . [DISCONTINUED] nicotine (NICODERM CQ - DOSED IN MG/24 HOURS) 21 mg/24hr patch Place 1 patch (21 mg total) onto the skin daily. X 6 weeks  . [DISCONTINUED] nicotine (NICODERM CQ) 14 mg/24hr patch Place 1 patch (14 mg total) onto the skin daily. Week 7 x 2 weeks  . [DISCONTINUED] nicotine (NICODERM CQ) 7 mg/24hr patch Place 1 patch (7 mg total) onto the skin daily. Week 9 x 3 weeks  . [DISCONTINUED] omeprazole (PRILOSEC) 40 MG capsule Take 1 capsule (40 mg total) by mouth daily. On empty stomach  . [DISCONTINUED] traMADol (ULTRAM) 50 MG tablet Take 1 tablet (50 mg total) by mouth daily as needed (left knee pain).   No Known Allergies Recent Results (from the past 2160 hour(s))  PSA, Medicare     Status: None   Collection Time: 03/01/18  8:17 AM  Result Value Ref Range   PSA 1.78 0.10 - 4.00 ng/ml    Comment: Test performed using Access Hybritech PSA Assay, a parmagnetic partical, chemiluminecent immunoassay.  TSH     Status: None   Collection Time: 03/01/18  8:17 AM  Result Value Ref Range   TSH 1.38 0.35 - 4.50 uIU/mL  Lipid panel     Status: Abnormal   Collection Time: 03/01/18  8:17  AM  Result Value Ref Range   Cholesterol 169 0 - 200 mg/dL  Comment: ATP III Classification       Desirable:  < 200 mg/dL               Borderline High:  200 - 239 mg/dL          High:  > = 240 mg/dL   Triglycerides 183.0 (H) 0.0 - 149.0 mg/dL    Comment: Normal:  <150 mg/dLBorderline High:  150 - 199 mg/dL   HDL 45.80 >39.00 mg/dL   VLDL 36.6 0.0 - 40.0 mg/dL   LDL Cholesterol 86 0 - 99 mg/dL   Total CHOL/HDL Ratio 4     Comment:                Men          Women1/2 Average Risk     3.4          3.3Average Risk          5.0          4.42X Average Risk          9.6          7.13X Average Risk          15.0          11.0                       NonHDL 122.91     Comment: NOTE:  Non-HDL goal should be 30 mg/dL higher than patient's LDL goal (i.e. LDL goal of < 70 mg/dL, would have non-HDL goal of < 100 mg/dL)  CBC with Differential/Platelet     Status: None   Collection Time: 03/01/18  8:17 AM  Result Value Ref Range   WBC 6.3 4.0 - 10.5 K/uL   RBC 4.54 4.22 - 5.81 Mil/uL   Hemoglobin 13.8 13.0 - 17.0 g/dL   HCT 39.8 39.0 - 52.0 %   MCV 87.8 78.0 - 100.0 fl   MCHC 34.6 30.0 - 36.0 g/dL   RDW 12.8 11.5 - 15.5 %   Platelets 201.0 150.0 - 400.0 K/uL   Neutrophils Relative % 46.6 43.0 - 77.0 %   Lymphocytes Relative 38.8 12.0 - 46.0 %   Monocytes Relative 10.1 3.0 - 12.0 %   Eosinophils Relative 3.8 0.0 - 5.0 %   Basophils Relative 0.7 0.0 - 3.0 %   Neutro Abs 2.9 1.4 - 7.7 K/uL   Lymphs Abs 2.5 0.7 - 4.0 K/uL   Monocytes Absolute 0.6 0.1 - 1.0 K/uL   Eosinophils Absolute 0.2 0.0 - 0.7 K/uL   Basophils Absolute 0.0 0.0 - 0.1 K/uL  Comprehensive metabolic panel     Status: None   Collection Time: 03/01/18  8:17 AM  Result Value Ref Range   Sodium 136 135 - 145 mEq/L   Potassium 4.3 3.5 - 5.1 mEq/L   Chloride 97 96 - 112 mEq/L   CO2 28 19 - 32 mEq/L   Glucose, Bld 99 70 - 99 mg/dL   BUN 17 6 - 23 mg/dL   Creatinine, Ser 0.98 0.40 - 1.50 mg/dL   Total Bilirubin 0.5 0.2 - 1.2 mg/dL    Alkaline Phosphatase 69 39 - 117 U/L   AST 20 0 - 37 U/L   ALT 32 0 - 53 U/L   Total Protein 6.8 6.0 - 8.3 g/dL   Albumin 4.6 3.5 - 5.2 g/dL   Calcium 10.1 8.4 - 10.5 mg/dL   GFR 80.98 >60.00 mL/min  Objective  Body mass index is 39.02 kg/m. Wt Readings from Last 3 Encounters:  05/11/18 256 lb 9.6 oz (116.4 kg)  02/03/18 281 lb 9.6 oz (127.7 kg)  11/06/17 277 lb 9.6 oz (125.9 kg)   Temp Readings from Last 3 Encounters:  05/11/18 98.2 F (36.8 C) (Oral)  02/03/18 98.2 F (36.8 C) (Oral)  11/06/17 98.1 F (36.7 C) (Oral)   BP Readings from Last 3 Encounters:  05/11/18 120/70  02/03/18 140/70  11/06/17 140/70   Pulse Readings from Last 3 Encounters:  05/11/18 78  02/03/18 71  11/06/17 78    Physical Exam  Constitutional: He is oriented to person, place, and time. Vital signs are normal. He appears well-developed and well-nourished. He is cooperative.  HENT:  Head: Normocephalic and atraumatic.  Mouth/Throat: Oropharynx is clear and moist and mucous membranes are normal.  Eyes: Pupils are equal, round, and reactive to light. Conjunctivae are normal.  Neck: Normal range of motion.  Cardiovascular: Normal rate, regular rhythm and normal heart sounds.  Pulmonary/Chest: Effort normal and breath sounds normal. He has no wheezes.  Abdominal: Soft. Bowel sounds are normal.  Musculoskeletal:       Right shoulder: He exhibits tenderness.       Cervical back: He exhibits tenderness.  Pain with ROM overhead and crossbody    Neurological: He is alert and oriented to person, place, and time. Gait normal.  Skin: Skin is warm, dry and intact.  Psychiatric: He has a normal mood and affect. His speech is normal and behavior is normal. Judgment and thought content normal. Cognition and memory are normal.  Nursing note and vitals reviewed.   Assessment   1. Fatigue/daytime sleepiness ddx osa not using cpap vs other b/w normal tsh, cmet, cbc. R/o hypogonadism  2. ED  3.  Cervicalgia likely related CT 08/30/17 spurring and DDD mild and right shoulder pain ddx arthritis, rotator cuff etiology  4. HTN controlled today  Consider statin in future and d/c gemfibrozil ASCVD score elevated  5.HM Plan   1. Check testosterone and vitamin D today  rec use of cpap pt could not tolerate previously  2. Check testosterone tomorrow  Prn viagra generic  3. Consider PT in future neck and shoulder  Xray right shoulder today  Disc otc topicals  Prn Tramadol  Consider ortho referral in future if continues to bother  4. Cont meds refilled meds today  5.  Had flu shot due again ? Other vaccines pt thinks pna 23 will get copy of vaccines asked pt to check again today   Colonoscopy due 2020  PSA nl 1.78 2019 consider check DRE in future  CT chest due 08/2018  Dermatology appt sch 05/24/18     Provider: Dr. Olivia Mackie McLean-Scocuzza-[facility]

## 2018-05-11 NOTE — Patient Instructions (Addendum)
[facility] in [place] please call them  Erectile Dysfunction Erectile dysfunction (ED) is the inability to get or keep an erection in order to have sexual intercourse. Erectile dysfunction may include:  Inability to get an erection.  Lack of enough hardness of the erection to allow penetration.  Loss of the erection before sex is finished.  What are the causes? This condition may be caused by:  Certain medicines, such as: ? Pain relievers. ? Antihistamines. ? Antidepressants. ? Blood pressure medicines. ? Water pills (diuretics). ? Ulcer medicines. ? Muscle relaxants. ? Drugs.  Excessive drinking.  Psychological causes, such as: ? Anxiety. ? Depression. ? Sadness. ? Exhaustion. ? Performance fear. ? Stress.  Physical causes, such as: ? Artery problems. This may include diabetes, smoking, liver disease, or atherosclerosis. ? High blood pressure. ? Hormonal problems, such as low testosterone. ? Obesity. ? Nerve problems. This may include back or pelvic injuries, diabetes mellitus, multiple sclerosis, or Parkinson disease.  What are the signs or symptoms? Symptoms of this condition include:  Inability to get an erection.  Lack of enough hardness of the erection to allow penetration.  Loss of the erection before sex is finished.  Normal erections at some times, but with frequent unsatisfactory episodes.  Low sexual satisfaction in either partner due to erection problems.  A curved penis occurring with erection. The curve may cause pain or the penis may be too curved to allow for intercourse.  Never having nighttime erections.  How is this diagnosed? This condition is often diagnosed by:  Performing a physical exam to find other diseases or specific problems with the penis.  Asking you detailed questions about the problem.  Performing blood tests to check for diabetes mellitus or to measure hormone levels.  Performing other tests to check for  underlying health conditions.  Performing an ultrasound exam to check for scarring.  Performing a test to check blood flow to the penis.  Doing a sleep study at home to measure nighttime erections.  How is this treated? This condition may be treated by:  Medicine taken by mouth to help you achieve an erection (oral medicine).  Hormone replacement therapy to replace low testosterone levels.  Medicine that is injected into the penis. Your health care provider may instruct you how to give yourself these injections at home.  Vacuum pump. This is a pump with a ring on it. The pump and ring are placed on the penis and used to create pressure that helps the penis become erect.  Penile implant surgery. In this procedure, you may receive: ? An inflatable implant. This consists of cylinders, a pump, and a reservoir. The cylinders can be inflated with a fluid that helps to create an erection, and they can be deflated after intercourse. ? A semi-rigid implant. This consists of two silicone rubber rods. The rods provide some rigidity. They are also flexible, so the penis can both curve downward in its normal position and become straight for sexual intercourse.  Blood vessel surgery, to improve blood flow to the penis. During this procedure, a blood vessel from a different part of the body is placed into the penis to allow blood to flow around (bypass) damaged or blocked blood vessels.  Lifestyle changes, such as exercising more, losing weight, and quitting smoking.  Follow these instructions at home: Medicines  Take over-the-counter and prescription medicines only as told by your health care provider. Do not increase the dosage without first discussing it with your health care  provider.  If you are using self-injections, perform injections as directed by your health care provider. Make sure to avoid any veins that are on the surface of the penis. After giving an injection, apply pressure to the  injection site for 5 minutes. General instructions  Exercise regularly, as directed by your health care provider. Work with your health care provider to lose weight, if needed.  Do not use any products that contain nicotine or tobacco, such as cigarettes and e-cigarettes. If you need help quitting, ask your health care provider.  Before using a vacuum pump, read the instructions that come with the pump and discuss any questions with your health care provider.  Keep all follow-up visits as told by your health care provider. This is important. Contact a health care provider if:  You feel nauseous.  You vomit. Get help right away if:  You are taking oral or injectable medicines and you have an erection that lasts longer than 4 hours. If your health care provider is unavailable, go to the nearest emergency room for evaluation. An erection that lasts much longer than 4 hours can result in permanent damage to your penis.  You have severe pain in your groin or abdomen.  You develop redness or severe swelling of your penis.  You have redness spreading up into your groin or lower abdomen.  You are unable to urinate.  You experience chest pain or a rapid heart beat (palpitations) after taking oral medicines. Summary  Erectile dysfunction (ED) is the inability to get or keep an erection during sexual intercourse. This problem can usually be treated successfully.  This condition is diagnosed based on a physical exam, your symptoms, and tests to determine the cause. Treatment varies depending on the cause, and may include medicines, hormone therapy, surgery, or vacuum pump.  You may need follow-up visits to make sure that you are using your medicines or devices correctly.  Get help right away if you are taking or injecting medicines and you have an erection that lasts longer than 4 hours. This information is not intended to replace advice given to you by your health care provider. Make sure  you discuss any questions you have with your health care provider. Document Released: 08/29/2000 Document Revised: 09/17/2016 Document Reviewed: 09/17/2016 Elsevier Interactive Patient Education  2017 [employer].

## 2018-05-12 ENCOUNTER — Other Ambulatory Visit: Payer: Self-pay | Admitting: Internal Medicine

## 2018-05-12 ENCOUNTER — Other Ambulatory Visit (INDEPENDENT_AMBULATORY_CARE_PROVIDER_SITE_OTHER): Payer: Medicare HMO

## 2018-05-12 DIAGNOSIS — E291 Testicular hypofunction: Secondary | ICD-10-CM

## 2018-05-12 DIAGNOSIS — M19011 Primary osteoarthritis, right shoulder: Secondary | ICD-10-CM

## 2018-05-12 DIAGNOSIS — N529 Male erectile dysfunction, unspecified: Secondary | ICD-10-CM

## 2018-05-12 DIAGNOSIS — M67911 Unspecified disorder of synovium and tendon, right shoulder: Secondary | ICD-10-CM

## 2018-05-12 DIAGNOSIS — R5383 Other fatigue: Secondary | ICD-10-CM

## 2018-05-13 ENCOUNTER — Other Ambulatory Visit: Payer: Self-pay | Admitting: Internal Medicine

## 2018-05-13 ENCOUNTER — Encounter: Payer: Self-pay | Admitting: Internal Medicine

## 2018-05-13 DIAGNOSIS — E291 Testicular hypofunction: Secondary | ICD-10-CM

## 2018-05-13 DIAGNOSIS — N529 Male erectile dysfunction, unspecified: Secondary | ICD-10-CM

## 2018-05-13 LAB — TESTOSTERONE TOTAL,FREE,BIO, MALES
ALBUMIN MSPROF: 4.5 g/dL (ref 3.6–5.1)
Sex Hormone Binding: 39 nmol/L (ref 22–77)
TESTOSTERONE FREE: 40.6 pg/mL — AB (ref 46.0–224.0)
TESTOSTERONE: 361 ng/dL (ref 250–827)
Testosterone, Bioavailable: 83.5 ng/dL — ABNORMAL LOW (ref >=110.0)

## 2018-05-13 LAB — VITAMIN D 25 HYDROXY (VIT D DEFICIENCY, FRACTURES): Vit D, 25-Hydroxy: 43 ng/mL (ref 30–100)

## 2018-05-18 ENCOUNTER — Other Ambulatory Visit: Payer: Self-pay | Admitting: Internal Medicine

## 2018-05-18 DIAGNOSIS — K219 Gastro-esophageal reflux disease without esophagitis: Secondary | ICD-10-CM

## 2018-05-18 DIAGNOSIS — I10 Essential (primary) hypertension: Secondary | ICD-10-CM

## 2018-05-18 MED ORDER — OMEPRAZOLE 40 MG PO CPDR: 40.0000 mg | DELAYED_RELEASE_CAPSULE | Freq: Every day | ORAL | 3 refills | Status: AC

## 2018-05-18 MED ORDER — LISINOPRIL-HYDROCHLOROTHIAZIDE 20-12.5 MG PO TABS: 2.0000 | ORAL_TABLET | Freq: Every day | ORAL | 3 refills | Status: AC

## 2018-05-18 MED ORDER — METOPROLOL SUCCINATE ER 25 MG PO TB24: 25.0000 mg | ORAL_TABLET | Freq: Every day | ORAL | 3 refills | Status: AC

## 2018-06-17 ENCOUNTER — Ambulatory Visit: Payer: Self-pay | Admitting: Urology

## 2018-07-15 ENCOUNTER — Encounter: Payer: Self-pay | Admitting: Internal Medicine

## 2018-07-20 ENCOUNTER — Ambulatory Visit: Payer: Self-pay | Admitting: Urology

## 2018-08-18 ENCOUNTER — Ambulatory Visit: Payer: Medicare HMO | Admitting: Internal Medicine

## 2018-10-05 DIAGNOSIS — E782 Mixed hyperlipidemia: Secondary | ICD-10-CM | POA: Diagnosis not present

## 2018-10-05 DIAGNOSIS — Z6837 Body mass index (BMI) 37.0-37.9, adult: Secondary | ICD-10-CM | POA: Diagnosis not present

## 2018-10-05 DIAGNOSIS — R69 Illness, unspecified: Secondary | ICD-10-CM | POA: Diagnosis not present

## 2018-10-05 DIAGNOSIS — M1712 Unilateral primary osteoarthritis, left knee: Secondary | ICD-10-CM | POA: Diagnosis not present

## 2018-10-05 DIAGNOSIS — I10 Essential (primary) hypertension: Secondary | ICD-10-CM | POA: Diagnosis not present

## 2018-10-05 DIAGNOSIS — K219 Gastro-esophageal reflux disease without esophagitis: Secondary | ICD-10-CM | POA: Diagnosis not present

## 2018-11-09 DIAGNOSIS — R69 Illness, unspecified: Secondary | ICD-10-CM | POA: Diagnosis not present

## 2018-11-09 DIAGNOSIS — I10 Essential (primary) hypertension: Secondary | ICD-10-CM | POA: Diagnosis not present

## 2018-11-09 DIAGNOSIS — Z9181 History of falling: Secondary | ICD-10-CM | POA: Diagnosis not present

## 2018-12-16 DIAGNOSIS — Z6837 Body mass index (BMI) 37.0-37.9, adult: Secondary | ICD-10-CM | POA: Diagnosis not present

## 2018-12-16 DIAGNOSIS — R69 Illness, unspecified: Secondary | ICD-10-CM | POA: Diagnosis not present

## 2018-12-16 DIAGNOSIS — I10 Essential (primary) hypertension: Secondary | ICD-10-CM | POA: Diagnosis not present

## 2019-02-23 DIAGNOSIS — Z86010 Personal history of colonic polyps: Secondary | ICD-10-CM | POA: Diagnosis not present

## 2019-02-23 DIAGNOSIS — K210 Gastro-esophageal reflux disease with esophagitis: Secondary | ICD-10-CM | POA: Diagnosis not present

## 2019-02-23 DIAGNOSIS — K591 Functional diarrhea: Secondary | ICD-10-CM | POA: Diagnosis not present

## 2019-03-01 ENCOUNTER — Other Ambulatory Visit: Payer: Self-pay | Admitting: Internal Medicine

## 2019-03-01 DIAGNOSIS — I10 Essential (primary) hypertension: Secondary | ICD-10-CM

## 2019-03-01 MED ORDER — AMLODIPINE BESYLATE 10 MG PO TABS: 10.0000 mg | ORAL_TABLET | Freq: Every day | ORAL | 0 refills | Status: DC

## 2019-05-11 ENCOUNTER — Other Ambulatory Visit: Payer: Self-pay | Admitting: Internal Medicine

## 2019-05-11 DIAGNOSIS — I10 Essential (primary) hypertension: Secondary | ICD-10-CM

## 2019-05-11 MED ORDER — AMLODIPINE BESYLATE 10 MG PO TABS: 10.0000 mg | ORAL_TABLET | Freq: Every day | ORAL | 0 refills | Status: AC
